# Patient Record
Sex: Male | Born: 1980 | Race: White | Hispanic: No | Marital: Single | Smoking: Former smoker
Health system: Southern US, Community
[De-identification: ages and names within clinical notes are randomized; demographics above are authoritative.]

## PROBLEM LIST (undated history)

## (undated) DIAGNOSIS — I10 Essential (primary) hypertension: Secondary | ICD-10-CM

---

## 2012-04-13 ENCOUNTER — Other Ambulatory Visit: Payer: Self-pay | Admitting: Internal Medicine

## 2012-04-13 DIAGNOSIS — N5089 Other specified disorders of the male genital organs: Secondary | ICD-10-CM

## 2012-04-29 ENCOUNTER — Other Ambulatory Visit: Payer: Self-pay

## 2013-02-01 ENCOUNTER — Ambulatory Visit (HOSPITAL_BASED_OUTPATIENT_CLINIC_OR_DEPARTMENT_OTHER)
Admission: RE | Admit: 2013-02-01 | Discharge: 2013-02-01 | Disposition: A | Discharge status: Home / Self Care | Payer: BC Managed Care – PPO | Source: Ambulatory Visit | Attending: Internal Medicine | Admitting: Internal Medicine

## 2013-02-01 ENCOUNTER — Other Ambulatory Visit (HOSPITAL_BASED_OUTPATIENT_CLINIC_OR_DEPARTMENT_OTHER): Payer: Self-pay

## 2013-02-01 ENCOUNTER — Other Ambulatory Visit (HOSPITAL_BASED_OUTPATIENT_CLINIC_OR_DEPARTMENT_OTHER): Payer: Self-pay | Admitting: Internal Medicine

## 2013-02-01 ENCOUNTER — Other Ambulatory Visit: Payer: Self-pay | Admitting: Internal Medicine

## 2013-02-01 DIAGNOSIS — IMO0002 Reserved for concepts with insufficient information to code with codable children: Secondary | ICD-10-CM

## 2013-02-01 DIAGNOSIS — N509 Disorder of male genital organs, unspecified: Secondary | ICD-10-CM

## 2013-02-01 DIAGNOSIS — N433 Hydrocele, unspecified: Secondary | ICD-10-CM | POA: Insufficient documentation

## 2013-02-01 DIAGNOSIS — N508 Other specified disorders of male genital organs: Secondary | ICD-10-CM | POA: Insufficient documentation

## 2016-06-18 ENCOUNTER — Ambulatory Visit (HOSPITAL_BASED_OUTPATIENT_CLINIC_OR_DEPARTMENT_OTHER)
Admission: RE | Admit: 2016-06-18 | Discharge: 2016-06-18 | Disposition: A | Discharge status: Home / Self Care | Payer: Worker's Compensation | Source: Ambulatory Visit | Attending: Physician Assistant | Admitting: Physician Assistant

## 2016-06-18 ENCOUNTER — Other Ambulatory Visit (HOSPITAL_BASED_OUTPATIENT_CLINIC_OR_DEPARTMENT_OTHER): Payer: Self-pay | Admitting: Physician Assistant

## 2016-06-18 DIAGNOSIS — I82442 Acute embolism and thrombosis of left tibial vein: Secondary | ICD-10-CM | POA: Insufficient documentation

## 2016-06-18 DIAGNOSIS — M79605 Pain in left leg: Secondary | ICD-10-CM

## 2017-04-16 ENCOUNTER — Other Ambulatory Visit: Payer: Self-pay | Admitting: Orthopedic Surgery

## 2017-04-16 DIAGNOSIS — M25511 Pain in right shoulder: Secondary | ICD-10-CM

## 2017-04-28 ENCOUNTER — Other Ambulatory Visit: Payer: Self-pay

## 2017-05-09 ENCOUNTER — Ambulatory Visit
Admission: RE | Admit: 2017-05-09 | Discharge: 2017-05-09 | Disposition: A | Discharge status: Home / Self Care | Payer: Worker's Compensation | Source: Ambulatory Visit | Attending: Orthopedic Surgery | Admitting: Orthopedic Surgery

## 2017-05-09 ENCOUNTER — Ambulatory Visit
Admission: RE | Admit: 2017-05-09 | Discharge: 2017-05-09 | Disposition: A | Discharge status: Home / Self Care | Payer: Self-pay | Source: Ambulatory Visit | Attending: Orthopedic Surgery | Admitting: Orthopedic Surgery

## 2017-05-09 ENCOUNTER — Other Ambulatory Visit: Payer: Self-pay | Admitting: Orthopedic Surgery

## 2017-05-09 DIAGNOSIS — M25511 Pain in right shoulder: Secondary | ICD-10-CM

## 2017-05-09 DIAGNOSIS — Z77018 Contact with and (suspected) exposure to other hazardous metals: Secondary | ICD-10-CM

## 2017-05-09 MED ORDER — IOPAMIDOL (ISOVUE-M 200) INJECTION 41%
15.0000 mL | Freq: Once | INTRAMUSCULAR | Status: AC
  Administered 2017-05-09: 15 mL via INTRA_ARTICULAR

## 2017-10-17 DIAGNOSIS — I10 Essential (primary) hypertension: Secondary | ICD-10-CM | POA: Insufficient documentation

## 2020-02-17 ENCOUNTER — Encounter (HOSPITAL_COMMUNITY): Payer: Self-pay | Admitting: Emergency Medicine

## 2020-02-17 ENCOUNTER — Emergency Department (HOSPITAL_COMMUNITY)
Admission: EM | Admit: 2020-02-17 | Discharge: 2020-02-18 | Disposition: A | Discharge status: Home / Self Care | Payer: Self-pay | Attending: Emergency Medicine | Admitting: Emergency Medicine

## 2020-02-17 DIAGNOSIS — N179 Acute kidney failure, unspecified: Secondary | ICD-10-CM

## 2020-02-17 DIAGNOSIS — I10 Essential (primary) hypertension: Secondary | ICD-10-CM | POA: Insufficient documentation

## 2020-02-17 DIAGNOSIS — T401X1A Poisoning by heroin, accidental (unintentional), initial encounter: Secondary | ICD-10-CM

## 2020-02-17 DIAGNOSIS — F1290 Cannabis use, unspecified, uncomplicated: Secondary | ICD-10-CM | POA: Insufficient documentation

## 2020-02-17 DIAGNOSIS — F1910 Other psychoactive substance abuse, uncomplicated: Secondary | ICD-10-CM | POA: Insufficient documentation

## 2020-02-17 DIAGNOSIS — Z20822 Contact with and (suspected) exposure to covid-19: Secondary | ICD-10-CM | POA: Insufficient documentation

## 2020-02-17 HISTORY — DX: Essential (primary) hypertension: I10

## 2020-02-17 LAB — CBC WITH DIFFERENTIAL/PLATELET
Abs Immature Granulocytes: 0.16 10*3/uL — ABNORMAL HIGH (ref 0.00–0.07)
Basophils Absolute: 0 10*3/uL (ref 0.0–0.1)
Basophils Relative: 0 %
Eosinophils Absolute: 0 10*3/uL (ref 0.0–0.5)
Eosinophils Relative: 0 %
HCT: 46.6 % (ref 39.0–52.0)
Hemoglobin: 15.3 g/dL (ref 13.0–17.0)
Immature Granulocytes: 1 %
Lymphocytes Relative: 4 %
Lymphs Abs: 0.6 10*3/uL — ABNORMAL LOW (ref 0.7–4.0)
MCH: 29.7 pg (ref 26.0–34.0)
MCHC: 32.8 g/dL (ref 30.0–36.0)
MCV: 90.3 fL (ref 80.0–100.0)
Monocytes Absolute: 1.2 10*3/uL — ABNORMAL HIGH (ref 0.1–1.0)
Monocytes Relative: 8 %
Neutro Abs: 12.9 10*3/uL — ABNORMAL HIGH (ref 1.7–7.7)
Neutrophils Relative %: 87 %
Platelets: 219 10*3/uL (ref 150–400)
RBC: 5.16 MIL/uL (ref 4.22–5.81)
RDW: 11.9 % (ref 11.5–15.5)
WBC: 14.9 10*3/uL — ABNORMAL HIGH (ref 4.0–10.5)
nRBC: 0 % (ref 0.0–0.2)

## 2020-02-17 LAB — COMPREHENSIVE METABOLIC PANEL
ALT: 21 U/L (ref 0–44)
AST: 26 U/L (ref 15–41)
Albumin: 4.2 g/dL (ref 3.5–5.0)
Alkaline Phosphatase: 58 U/L (ref 38–126)
Anion gap: 14 (ref 5–15)
BUN: 19 mg/dL (ref 6–20)
CO2: 24 mmol/L (ref 22–32)
Calcium: 9.3 mg/dL (ref 8.9–10.3)
Chloride: 100 mmol/L (ref 98–111)
Creatinine, Ser: 1.87 mg/dL — ABNORMAL HIGH (ref 0.61–1.24)
GFR calc Af Amer: 51 mL/min — ABNORMAL LOW (ref >60)
GFR calc non Af Amer: 44 mL/min — ABNORMAL LOW (ref >60)
Glucose, Bld: 131 mg/dL — ABNORMAL HIGH (ref 70–99)
Potassium: 4.6 mmol/L (ref 3.5–5.1)
Sodium: 138 mmol/L (ref 135–145)
Total Bilirubin: 0.8 mg/dL (ref 0.3–1.2)
Total Protein: 6.7 g/dL (ref 6.5–8.1)

## 2020-02-17 LAB — RAPID URINE DRUG SCREEN, HOSP PERFORMED
Amphetamines: NOT DETECTED
Barbiturates: NOT DETECTED
Benzodiazepines: NOT DETECTED
Cocaine: POSITIVE — AB
Opiates: POSITIVE — AB
Tetrahydrocannabinol: POSITIVE — AB

## 2020-02-17 MED ORDER — SODIUM CHLORIDE 0.9 % IV BOLUS
1000.0000 mL | Freq: Once | INTRAVENOUS | Status: AC
  Administered 2020-02-17: 1000 mL via INTRAVENOUS

## 2020-02-17 NOTE — Progress Notes (Signed)
   02/17/20 2320  TOC ED Mini Assessment  TOC Time spent with patient (minutes): 60  PING Used in TOC Assessment No  Admission or Readmission Diverted Yes  Interventions which prevented an admission or readmission Patient counseling;Other (must enter comment) (Detox resources)  What brought you to the Emergency Department?  Heroin overdose  Barriers to Discharge Continued Medical Work up  Means of departure Not know  Key Contact 1 [facility]  Spoke with Towanda Malkin Date 02/17/20  Contact time 2321  Contact Phone Number [phone]  Call outcome Pt is eligible for transfer to detox facility  Patient states their goals for this hospitalization and ongoing recovery are: Get detox, resatrt recovery program.  CMS Medicare.gov Compare Post Acute Care list provided to: Patient  Choice offered to / list presented to  Patient

## 2020-02-17 NOTE — ED Triage Notes (Signed)
Pt presents to ED BIB [facility]. Per EMS Pt was found by [employer] unresponsive and given 2mg  of Narcan IN. Pt admits to using heroin.  BP 140/70 HR 90  R 18 98% RA

## 2020-02-17 NOTE — Social Work (Signed)
TOC Team met with Pt at bedside. °Team provided supportive listening and substance abuse counseling.  °Team provided NA resources as well as [facility] resources. °When discharged Pt is eligible for transport to [facility] via [facility] After Hours @[phone] ° °

## 2020-02-17 NOTE — ED Provider Notes (Signed)
[facility] EMERGENCY DEPARTMENT Provider Note   CSN: [id number] Arrival date & time: 02/17/20  2151     History Chief Complaint  Patient presents with  . Drug Overdose    Erik Campbell is a 39 y.o. male with past medical history of hypertension, drug abuse, presenting to the emergency department via EMS for suspected drug overdose.  Per EMS, [employer] found patient unresponsive in the driver seat of a car that was in the middle of the road. Patient was noted to have pinpoint pupils. They administered 2 mg of intranasal Narcan and patient reportedly became awake and alert immediately.  Patient is mostly complaining of thirst at this time.  He states he snorted heroin and smoked cocaine. He also endorses marijuana use.  Denies alcohol.  He has no other complaints at this time.  The history is provided by the patient and the EMS personnel.       Past Medical History:  Diagnosis Date  . Hypertension     There are no problems to display for this patient.   History reviewed. No pertinent surgical history.     History reviewed. No pertinent family history.  Social History   Tobacco Use  . Smoking status: Not on file  Substance Use Topics  . Alcohol use: Not on file  . Drug use: Not on file    Home Medications Prior to Admission medications   Not on File    Allergies    Patient has no allergy information on record.  Review of Systems   Review of Systems  All other systems reviewed and are negative.   Physical Exam Updated Vital Signs BP 107/74   Pulse 66   Temp 97.7 F (36.5 C) (Oral)   Resp 16   SpO2 92%   Physical Exam Vitals and nursing note reviewed.  Constitutional:      Appearance: He is well-developed.  HENT:     Head: Normocephalic and atraumatic.  Eyes:     Conjunctiva/sclera: Conjunctivae normal.     Comments: Pupils are equal and constricted, reactive to light  Cardiovascular:     Rate and Rhythm: Normal rate and regular  rhythm.  Pulmonary:     Effort: Pulmonary effort is normal. No respiratory distress.     Breath sounds: Normal breath sounds.  Abdominal:     General: Bowel sounds are normal.     Palpations: Abdomen is soft.     Tenderness: There is no abdominal tenderness.  Skin:    General: Skin is warm.  Neurological:     Mental Status: He is alert and oriented to person, place, and time.  Psychiatric:        Behavior: Behavior normal.     ED Results / Procedures / Treatments   Labs (all labs ordered are listed, but only abnormal results are displayed) Labs Reviewed  COMPREHENSIVE METABOLIC PANEL - Abnormal; Notable for the following components:      Result Value   Glucose, Bld 131 (*)    Creatinine, Ser 1.87 (*)    GFR calc non Af Amer 44 (*)    GFR calc Af Amer 51 (*)    All other components within normal limits  CBC WITH DIFFERENTIAL/PLATELET - Abnormal; Notable for the following components:   WBC 14.9 (*)    Neutro Abs 12.9 (*)    Lymphs Abs 0.6 (*)    Monocytes Absolute 1.2 (*)    Abs Immature Granulocytes 0.16 (*)    All  other components within normal limits  RAPID URINE DRUG SCREEN, HOSP PERFORMED - Abnormal; Notable for the following components:   Opiates POSITIVE (*)    Cocaine POSITIVE (*)    Tetrahydrocannabinol POSITIVE (*)    All other components within normal limits  RESPIRATORY PANEL BY RT PCR (FLU A&B, COVID)  ETHANOL    EKG EKG Interpretation  Date/Time:  Thursday February 17 2020 21:52:48 EDT Ventricular Rate:  84 PR Interval:    QRS Duration: 89 QT Interval:  362 QTC Calculation: 428 R Axis:   31 Text Interpretation: Sinus rhythm No old tracing to compare Confirmed by Jacalyn Lefevre [phone]) on 02/17/2020 10:23:49 PM   Radiology No results found.  Procedures Procedures (including critical care time)  Medications Ordered in ED Medications  sodium chloride 0.9 % bolus 1,000 mL (1,000 mLs Intravenous New Bag/Given 02/17/20 2352)    ED Course  I  have reviewed the triage vital signs and the nursing notes.  Pertinent labs & imaging results that were available during my care of the patient were reviewed by me and considered in my medical decision making (see chart for details).  Clinical Course as of Feb 18 20  Thu Feb 17, 2020  2240 Social work provided outpatient resources to patient regarding detox.    [JR]  2315 [employer] reporting patient is to be discharged to police custody, currently obtaining warrant for patient's blood for DWI.   [JR]    Clinical Course User Index [JR] Daxter Paule, [place] N, PA-C   MDM Rules/Calculators/A&P                          Pt presenting as unintentional overdose of heroin and cocaine, found by [employer] unresponsive in driver seat of a vehicle that was stopped in the middle of the road.  [employer] administered 2 mg of intranasal Narcan and patient was alert.  He states he snorted heroin and smoked cocaine, also endorses marijuana use.  Patient is drowsy though oriented and without medical complaints.  Initial labs reveal slight leukocytosis of 14.9, creatinine 1.8, unclear baseline.  He is given IV fluids.  Vital signs are stable.  UDS is positive for opiates, cocaine and marijuana.  Covid swab is pending.  Care assumed at shift change by PA McDonald.  Anticipate discharge after IV fluids.  Patient to be discharged to [employer] custody.  [employer] is currently obtaining a warrant for patient's blood for DWI.  Final Clinical Impression(s) / ED Diagnoses Final diagnoses:  Polysubstance abuse (HCC)  Accidental overdose of heroin, initial encounter [facility])    Rx / DC Orders ED Discharge Orders    None       Anniyah Mood, [place] N, PA-C 02/18/20 Shanon Payor, MD 02/18/20 1536

## 2020-02-17 NOTE — ED Provider Notes (Signed)
39 year old male received a signout from [place] pending IV fluids and COVID-19 test. Per his HPI:   "Erik Campbell is a 39 y.o. male with past medical history of hypertension, drug abuse, presenting to the emergency department via EMS for suspected drug overdose.  Per EMS, [employer] found patient down unresponsive w pinpoint pupils. They administered 2 mg of intranasal Narcan and patient reportedly because awake and alert immediately.  Patient is mostly complaining of thirst at this time.  He states he snorted 1 gram of "opiates," is unable to elaborate more on details of the drug.  Denies alcohol or other drug use.  He has no other complaints at this time.  The history is provided by the patient and the EMS personnel."   On my evaluation, patient has no complaints.  He is requesting ginger ale to drink.  He reports that earlier he spoke with social work and would still like to go to detox.  Physical Exam  BP 112/86 (BP Location: Right Arm)    Pulse 64    Temp 98.2 F (36.8 C) (Oral)    Resp 16    SpO2 98%   Physical Exam Vitals and nursing note reviewed.  Constitutional:      Appearance: He is well-developed.     Comments: Sleeping on entry to the room, but arouses easily to voice.  HENT:     Head: Normocephalic and atraumatic.  Eyes:     Pupils: Pupils are equal, round, and reactive to light.  Cardiovascular:     Rate and Rhythm: Normal rate and regular rhythm.     Pulses: Normal pulses.     Heart sounds: Normal heart sounds. No murmur heard.  No friction rub. No gallop.   Pulmonary:     Effort: Pulmonary effort is normal. No respiratory distress.     Breath sounds: No stridor. No wheezing, rhonchi or rales.  Chest:     Chest wall: No tenderness.  Musculoskeletal:     Cervical back: Neck supple.  Neurological:     Mental Status: He is alert and oriented to person, place, and time.     Cranial Nerves: No cranial nerve deficit.     Comments: Answers questions appropriately.   Speech is not slurred.  Psychiatric:        Behavior: Behavior normal.     ED Course/Procedures   Clinical Course as of Feb 17 341  Thu Feb 17, 2020  2240 Social work provided outpatient resources to patient regarding detox.    [JR]  2315 [employer] reporting patient is to be discharged to police custody, currently obtaining warrant for patient's blood for DWI.   [JR]    Clinical Course User Index [JR] Robinson, [place] N, PA-C    Procedures  MDM  39 year old male received at signout from [facility] pending COVID-19 test.  Please see her note for further work-up and medical decision making.  In brief, the patient brought in after he was found unresponsive by [employer].  He was given Narcan in route and endorsed heroin use tonight.  He was found to have an AKI that was treated with IV fluids.  He has not required any additional doses of Narcan since arriving in the ER.  On my evaluation, patient is not hypoxic and has no tachypnea.  He is sleeping, but arouses easily and answers questions in complete, fluent sentences.  He is requesting some ginger ale to drink.  He notably has already gotten up and walked to  the restroom.  We will plan to fluid challenge the patient.  Initial plan was that patient was going to be discharged to jail.  However, officers now reports that the patient has been served paperwork for an upcoming court date.  He will not be incarcerated tonight.  Earlier Quar[employer] manager, social work discussed detox options with the patient and he is eligible for transport to [facility] detox via safe transport after he is medically cleared.  On my evaluation with the patient, he is still agreeable to being discharged to detox facility.  We will need to make sure that the patient is successfully fluid challenged and alert than he can [facility].  Will reassess after fluid challenge.  Patient has been successfully fluid challenge.  He is medically cleared at this time.  RN will arrange  safe [facility].  ER return precautions given.  Safe for discharge at this time.    Barkley Boards, PA-C 02/18/20 0342    Nira Conn, MD 02/18/20 [phone]

## 2020-02-18 ENCOUNTER — Encounter (HOSPITAL_COMMUNITY): Payer: Self-pay | Admitting: Emergency Medicine

## 2020-02-18 LAB — RESPIRATORY PANEL BY RT PCR (FLU A&B, COVID)
Influenza A by PCR: NEGATIVE
Influenza B by PCR: NEGATIVE
SARS Coronavirus 2 by RT PCR: NEGATIVE

## 2020-02-18 NOTE — Discharge Instructions (Signed)
Thank you for allowing me to care for you today in the Emergency Department.   You were seen today after an accidental overdose.  You were given Narcan.  You were found to have an increase in your kidney function, which was treated with fluids.  You were agreeable to being transferred to [facility] for detox.  Return to the emergency department for new or worsening symptoms.

## 2020-02-18 NOTE — ED Notes (Signed)
Patient verbalizes understanding of discharge instructions. Opportunity for questioning and answers were provided. Armband removed by staff, pt discharged from ED to [facility] via Safe Transport stable & ambulatory

## 2020-02-18 NOTE — ED Notes (Addendum)
Safe Transport has been called and in route to pick up pt. [facility] notified of pt's discharge/anticipated arrival, are expecting pt.

## 2020-02-18 NOTE — ED Notes (Signed)
Pt provided ginger ale for fluid challenge per request. Pt tolerating well, able to ambulate to/from RR without assistance, CA&O. NAD noted, no requests/needs expressed at this time.

## 2020-03-21 ENCOUNTER — Other Ambulatory Visit: Payer: Self-pay

## 2020-03-21 ENCOUNTER — Ambulatory Visit: Payer: Self-pay | Attending: Family | Admitting: Family

## 2020-03-21 ENCOUNTER — Encounter: Payer: Self-pay | Admitting: Family

## 2020-03-21 ENCOUNTER — Other Ambulatory Visit: Payer: Self-pay | Admitting: Family Medicine

## 2020-03-21 VITALS — BP 143/87 | HR 53 | Resp 16 | Ht 71.0 in | Wt 213.0 lb

## 2020-03-21 DIAGNOSIS — Z131 Encounter for screening for diabetes mellitus: Secondary | ICD-10-CM

## 2020-03-21 DIAGNOSIS — I10 Essential (primary) hypertension: Secondary | ICD-10-CM

## 2020-03-21 DIAGNOSIS — Z7689 Persons encountering health services in other specified circumstances: Secondary | ICD-10-CM

## 2020-03-21 DIAGNOSIS — R350 Frequency of micturition: Secondary | ICD-10-CM

## 2020-03-21 LAB — POCT URINALYSIS DIP (CLINITEK)
Bilirubin, UA: NEGATIVE
Blood, UA: NEGATIVE
Glucose, UA: NEGATIVE mg/dL
Ketones, POC UA: NEGATIVE mg/dL
Leukocytes, UA: NEGATIVE
Nitrite, UA: NEGATIVE
POC PROTEIN,UA: NEGATIVE
Spec Grav, UA: 1.015 (ref 1.010–1.025)
Urobilinogen, UA: 0.2 E.U./dL
pH, UA: 6.5 (ref 5.0–8.0)

## 2020-03-21 MED ORDER — LISINOPRIL 30 MG PO TABS: 30.0000 mg | ORAL_TABLET | Freq: Every day | ORAL | 0 refills | Status: DC

## 2020-03-21 NOTE — Patient Instructions (Signed)
Increase Lisinopril to 30 mg/day for high blood pressure. Lab today. Blood pressure check in 1 month with clinical pharmacist. Follow-up with primary physician in 2 months or sooner if needed. Hypertension, Adult Hypertension is another name for high blood pressure. High blood pressure forces your heart to work harder to pump blood. This can cause problems over time. There are two numbers in a blood pressure reading. There is a top number (systolic) over a bottom number (diastolic). It is best to have a blood pressure that is below 120/80. Healthy choices can help lower your blood pressure, or you may need medicine to help lower it. What are the causes? The cause of this condition is not known. Some conditions may be related to high blood pressure. What increases the risk?  Smoking.  Having type 2 diabetes mellitus, high cholesterol, or both.  Not getting enough exercise or physical activity.  Being overweight.  Having too much fat, sugar, calories, or salt (sodium) in your diet.  Drinking too much alcohol.  Having long-term (chronic) kidney disease.  Having a family history of high blood pressure.  Age. Risk increases with age.  Race. You may be at higher risk if you are African American.  Gender. Men are at higher risk than women before age 85. After age 36, women are at higher risk than men.  Having obstructive sleep apnea.  Stress. What are the signs or symptoms?  High blood pressure may not cause symptoms. Very high blood pressure (hypertensive crisis) may cause: ? Headache. ? Feelings of worry or nervousness (anxiety). ? Shortness of breath. ? Nosebleed. ? A feeling of being sick to your stomach (nausea). ? Throwing up (vomiting). ? Changes in how you see. ? Very bad chest pain. ? Seizures. How is this treated?  This condition is treated by making healthy lifestyle changes, such as: ? Eating healthy foods. ? Exercising more. ? Drinking less alcohol.  Your  health care provider may prescribe medicine if lifestyle changes are not enough to get your blood pressure under control, and if: ? Your top number is above 130. ? Your bottom number is above 80.  Your personal target blood pressure may vary. Follow these instructions at home: Eating and drinking   If told, follow the DASH eating plan. To follow this plan: ? Fill one half of your plate at each meal with fruits and vegetables. ? Fill one fourth of your plate at each meal with whole grains. Whole grains include whole-wheat pasta, brown rice, and whole-grain bread. ? Eat or drink low-fat dairy products, such as skim milk or low-fat yogurt. ? Fill one fourth of your plate at each meal with low-fat (lean) proteins. Low-fat proteins include fish, chicken without skin, eggs, beans, and tofu. ? Avoid fatty meat, cured and processed meat, or chicken with skin. ? Avoid pre-made or processed food.  Eat less than 1,500 mg of salt each day.  Do not drink alcohol if: ? Your doctor tells you not to drink. ? You are pregnant, may be pregnant, or are planning to become pregnant.  If you drink alcohol: ? Limit how much you use to:  0-1 drink a day for women.  0-2 drinks a day for men. ? Be aware of how much alcohol is in your drink. In the [place] one drink equals one 12 oz bottle of beer (355 mL), one 5 oz glass of wine (148 mL), or one 1 oz glass of hard liquor (44 mL). Lifestyle   Work with  your doctor to stay at a healthy weight or to lose weight. Ask your doctor what the best weight is for you.  Get at least 30 minutes of exercise most days of the week. This may include walking, swimming, or biking.  Get at least 30 minutes of exercise that strengthens your muscles (resistance exercise) at least 3 days a week. This may include lifting weights or doing Pilates.  Do not use any products that contain nicotine or tobacco, such as cigarettes, e-cigarettes, and chewing tobacco. If you need help  quitting, ask your doctor.  Check your blood pressure at home as told by your doctor.  Keep all follow-up visits as told by your doctor. This is important. Medicines  Take over-the-counter and prescription medicines only as told by your doctor. Follow directions carefully.  Do not skip doses of blood pressure medicine. The medicine does not work as well if you skip doses. Skipping doses also puts you at risk for problems.  Ask your doctor about side effects or reactions to medicines that you should watch for. Contact a doctor if you:  Think you are having a reaction to the medicine you are taking.  Have headaches that keep coming back (recurring).  Feel dizzy.  Have swelling in your ankles.  Have trouble with your vision. Get help right away if you:  Get a very bad headache.  Start to feel mixed up (confused).  Feel weak or numb.  Feel faint.  Have very bad pain in your: ? Chest. ? Belly (abdomen).  Throw up more than once.  Have trouble breathing. Summary  Hypertension is another name for high blood pressure.  High blood pressure forces your heart to work harder to pump blood.  For most people, a normal blood pressure is less than 120/80.  Making healthy choices can help lower blood pressure. If your blood pressure does not get lower with healthy choices, you may need to take medicine. This information is not intended to replace advice given to you by your health care provider. Make sure you discuss any questions you have with your health care provider. Document Revised: 01/14/2018 Document Reviewed: 01/14/2018 Elsevier Patient Education  2020 [employer].

## 2020-03-21 NOTE — Telephone Encounter (Signed)
Medication Refill - Medication: Lisinopril, meloxicam   Has the patient contacted their pharmacy? Yes.   (Agent: If no, request that the patient contact the pharmacy for the refill.) (Agent: If yes, when and what did the pharmacy advise?)  Preferred Pharmacy (with phone number or street name):  [facility] [place], [place] - [address]  [address] [place]  [address]  Phone: [phone] Fax: [phone]  Hours: Not open 24 hours     Agent: Please be advised that RX refills may take up to 3 business days. We ask that you follow-up with your pharmacy.

## 2020-03-21 NOTE — Progress Notes (Signed)
Patient ID: Erik Campbell, male    DOB: Sep 29, 1980  MRN: [id number]  CC: Hypertension Follow-Up and Establish Care  Subjective: Erik Campbell is a 39 y.o. male with history of hypertension who presents for hypertension follow-up and to establish care.    1. BLOOD PRESSURE CONCERN: 12/13/2019: Visit at [facility] with physician assistant Mancel Bale. Blood pressure very uncontrolled at that time. Patient noncompliant with taking medication. Patient agreed to begin taking medication. Follow-up in 2 weeks for blood pressure check to see if titration of medication is needed.  03/21/2020: Presents today with blood pressure concerns and requesting medication refill. Reports that he is currently at [facility] in a detox program after being charged with a DWI in September 2021 and says he will be discharged from the program in 3 days. Reports he had high blood pressure beginning in his 30's but noticed that his numbers are continuing to increase as he becomes older.  Currently taking: see medication list Have you taken your blood pressure medication today: [x]  Yes []  No  Med Adherence: [x]  Yes    []  No Medication side effects: []  Yes    [x]  No  Adherence with salt restriction: [x]  Yes    []  No Exercise: Yes [x]  No []  Home Monitoring?: [x]  Yes    []  No Monitoring Frequency: [x]  Yes    []  No  Home BP results range: [x]  Yes, 120's-140's/90s Smoking [x]  Yes []  No SOB? []  Yes    [x]  No Chest Pain?: []  Yes    [x]  No Leg swelling?: []  Yes    [x]  No Headaches?: []  Yes    [x]  No Dizziness? []  Yes    [x]  No  2. ESTABLISH CARE: PRESENT ILLNESS:   Allergies: denies  Alcohol and drug use: On 02/17/2020 positive for opiates, cocaine, and tetrahydrocannabinol. Currently at [facility] in detox program for DWI. Reports he has phlebitis of left arm from intravenous infusions during admission to the hospital on 02/17/2020.   Medications: Meloxicam for shoulder pain right shoulder  PAST  HISTORY:   Childhood Illnesses: denies   Surgical History: right shoulder history 2018 and 2019  Mental Health History: denies  FAMILY HISTORY:   Mother - hypertension   Maternal grandfather - hypertension  PERSONAL AND SOCIAL HISTORY:   Occupation:   Last year of schooling: community college   Home: alone  3. FREQUENT URINATION: Reports having frequent urination. Denies pain.   Current Outpatient Medications on File Prior to Visit  Medication Sig Dispense Refill  . meloxicam (MOBIC) 7.5 MG tablet Take by mouth.     No current facility-administered medications on file prior to visit.    No Known Allergies  Social History   Socioeconomic History  . Marital status: Single    Spouse name: Not on file  . Number of children: Not on file  . Years of education: Not on file  . Highest education level: Not on file  Occupational History  . Not on file  Tobacco Use  . Smoking status: Former  . Smokeless tobacco: Never Used  Vaping Use  . Vaping Use: Never used  Substance and Sexual Activity  . Alcohol use: Not Currently    Comment: not in the past 30 days   . Drug use: Not Currently    Comment: not in the past 30 days   . Sexual activity: Not Currently    Comment: not in the past 30 days   Other Topics Concern  .  Not on file  Social History Narrative  . Not on file   Social Determinants of Health   Financial Resource Strain:   . Difficulty of Paying Living Expenses: Not on file  Food Insecurity:   . Worried About Programme researcher, broadcasting/film/video in the Last Year: Not on file  . Ran Out of Food in the Last Year: Not on file  Transportation Needs:   . Lack of Transportation (Medical): Not on file  . Lack of Transportation (Non-Medical): Not on file  Physical Activity:   . Days of Exercise per Week: Not on file  . Minutes of Exercise per Session: Not on file  Stress:   . Feeling of Stress : Not on file  Social Connections:   . Frequency of  Communication with Friends and Family: Not on file  . Frequency of Social Gatherings with Friends and Family: Not on file  . Attends Religious Services: Not on file  . Active Member of Clubs or Organizations: Not on file  . Attends Banker Meetings: Not on file  . Marital Status: Not on file  Intimate Partner Violence:   . Fear of Current or Ex-Partner: Not on file  . Emotionally Abused: Not on file  . Physically Abused: Not on file  . Sexually Abused: Not on file    No family history on file.  No past surgical history on file.  ROS: Review of Systems Negative except as stated above  PHYSICAL EXAM: BP (!) 143/87   Pulse (!) 53   Resp 16   Ht 5\' 11"  (1.803 m)   Wt 213 lb (96.6 kg)   SpO2 98%   BMI 29.71 kg/m   Physical Exam General appearance - alert, well appearing, and in no distress and oriented to person, place, and time Mental status - alert, oriented to person, place, and time, normal mood, behavior, speech, dress, motor activity, and thought processes Neck - supple, no significant adenopathy Lymphatics - no palpable lymphadenopathy, no hepatosplenomegaly Chest - clear to auscultation, no wheezes, rales or rhonchi, symmetric air entry, no tachypnea, retractions or cyanosis Heart - normal rate, regular rhythm, normal S1, S2, no murmurs, rubs, clicks or gallops Neurological - alert, oriented, normal speech, no focal findings or movement disorder noted  Results for orders placed or performed in visit on 03/21/20  Hemoglobin A1c  Result Value Ref Range   Hgb A1c MFr Bld 5.1 4.8 - 5.6 %   Est. average glucose Bld gHb Est-mCnc 100 mg/dL  POCT URINALYSIS DIP (CLINITEK)  Result Value Ref Range   Color, UA yellow yellow   Clarity, UA clear clear   Glucose, UA negative negative mg/dL   Bilirubin, UA negative negative   Ketones, POC UA negative negative mg/dL   Spec Grav, UA 13/02/21 9.407 - 1.025   Blood, UA negative negative   pH, UA 6.5 5.0 - 8.0   POC  PROTEIN,UA negative negative, trace   Urobilinogen, UA 0.2 0.2 or 1.0 E.U./dL   Nitrite, UA Negative Negative   Leukocytes, UA Negative Negative    ASSESSMENT AND PLAN: 1. Essential hypertension: - Blood pressure not at goal during today's visit. Patient asymptomatic without chest pressure, chest pain, palpitations, and shortness of breath. - Increase Lisinopril from 20 mg daily to 30 mg daily.  - Follow-up with in 4 weeks with clinical pharmacist for blood pressure check. Write down your blood pressure readings each day and bring those results along with your home blood pressure monitor to your  appointment. Medications may be adjusted at that time if needed. - BMP to check kidney function and electrolyte balance.  - Follow-up with primary physician in 3 months or sooner if needed. - lisinopril (ZESTRIL) 30 MG tablet; Take 1 tablet (30 mg total) by mouth daily.  Dispense: 30 tablet; Refill: 0 - Basic Metabolic Panel; Future  2. Encounter to establish care: - Patient to establish care after discharge from hospital.  - Health history completed today.  - Return in 2 months or sooner if needed for physical examination with primary physician.  3. Frequent urination: - Urinalysis to screen for urinary tract infection. Resulted negative.  - Urine cytology to screen for sexually transmitted infections.  - POCT URINALYSIS DIP (CLINITEK) - Urine cytology ancillary only  4. Diabetes mellitus screening: - Patient with frequent urination.  - Hemoglobin A1C to screen for pre-diabetes/diabetes, normal today at 5.1%. - Hemoglobin A1c  Patient was given the opportunity to ask questions.  Patient verbalized understanding of the plan and was able to repeat key elements of the plan. Patient was given clear instructions to go to Emergency Department or return to medical center if symptoms don't improve, worsen, or new problems develop.The patient verbalized understanding.   Orders Placed This Encounter   Procedures  . Hemoglobin A1c  . Basic Metabolic Panel  . POCT URINALYSIS DIP (CLINITEK)     Requested Prescriptions   Signed Prescriptions Disp Refills  . lisinopril (ZESTRIL) 30 MG tablet 30 tablet 0    Sig: Take 1 tablet (30 mg total) by mouth daily.    Return in about 4 weeks (around 04/18/2020) for with Franky Macho and 2 months with any primary physician for physical exam.  Carlo Lorson Jodi Geralds, NP

## 2020-03-22 LAB — HEMOGLOBIN A1C
Est. average glucose Bld gHb Est-mCnc: 100 mg/dL
Hgb A1c MFr Bld: 5.1 % (ref 4.8–5.6)

## 2020-03-24 LAB — URINE CYTOLOGY ANCILLARY ONLY
Bacterial Vaginitis-Urine: NEGATIVE
Candida Urine: NEGATIVE
Chlamydia: NEGATIVE
Comment: NEGATIVE
Comment: NEGATIVE
Comment: NORMAL
Neisseria Gonorrhea: NEGATIVE
Trichomonas: NEGATIVE

## 2020-03-24 NOTE — Progress Notes (Signed)
Please call patient with updates.   No pre-diabetes/diabetes.  No sexually transmitted infections in urine.  No UTI.   Remind patient that he should come in for blood work to check his kidney function prior to his appointment on 04/18/2020. Does not need appointment for lab.

## 2020-03-29 ENCOUNTER — Telehealth: Payer: Self-pay

## 2020-03-29 NOTE — Telephone Encounter (Signed)
Contacted pt to go over lab results lvm  

## 2020-04-05 ENCOUNTER — Telehealth (HOSPITAL_COMMUNITY): Payer: Self-pay | Admitting: Behavioral Health

## 2020-04-12 ENCOUNTER — Ambulatory Visit (HOSPITAL_COMMUNITY): Payer: No Payment, Other | Admitting: Behavioral Health

## 2020-04-18 ENCOUNTER — Ambulatory Visit: Payer: Self-pay | Admitting: Pharmacist

## 2020-05-23 ENCOUNTER — Encounter: Payer: Self-pay | Admitting: Family Medicine

## 2020-06-30 ENCOUNTER — Emergency Department (HOSPITAL_COMMUNITY)
Admission: EM | Admit: 2020-06-30 | Discharge: 2020-07-01 | Disposition: A | Discharge status: Home / Self Care | Payer: Self-pay | Attending: Emergency Medicine | Admitting: Emergency Medicine

## 2020-06-30 ENCOUNTER — Other Ambulatory Visit: Payer: Self-pay

## 2020-06-30 ENCOUNTER — Emergency Department (HOSPITAL_COMMUNITY): Payer: Self-pay

## 2020-06-30 DIAGNOSIS — R220 Localized swelling, mass and lump, head: Secondary | ICD-10-CM | POA: Insufficient documentation

## 2020-06-30 DIAGNOSIS — I10 Essential (primary) hypertension: Secondary | ICD-10-CM | POA: Insufficient documentation

## 2020-06-30 DIAGNOSIS — F1110 Opioid abuse, uncomplicated: Secondary | ICD-10-CM | POA: Insufficient documentation

## 2020-06-30 DIAGNOSIS — Z87891 Personal history of nicotine dependence: Secondary | ICD-10-CM | POA: Insufficient documentation

## 2020-06-30 DIAGNOSIS — H9193 Unspecified hearing loss, bilateral: Secondary | ICD-10-CM | POA: Insufficient documentation

## 2020-06-30 DIAGNOSIS — Z79899 Other long term (current) drug therapy: Secondary | ICD-10-CM | POA: Insufficient documentation

## 2020-06-30 NOTE — ED Triage Notes (Addendum)
Pt presents to ED POV. Pt c/o bilateral hearing loss. Pt reports that he thinks he overdosed. Pt states that he used heroin and woke up and could not hear anything. And reports that he has knot on back of head.

## 2020-07-01 ENCOUNTER — Emergency Department (HOSPITAL_COMMUNITY): Payer: Self-pay

## 2020-07-01 LAB — COMPREHENSIVE METABOLIC PANEL
ALT: 489 U/L — ABNORMAL HIGH (ref 0–44)
AST: 237 U/L — ABNORMAL HIGH (ref 15–41)
Albumin: 3.5 g/dL (ref 3.5–5.0)
Alkaline Phosphatase: 48 U/L (ref 38–126)
Anion gap: 11 (ref 5–15)
BUN: 14 mg/dL (ref 6–20)
CO2: 28 mmol/L (ref 22–32)
Calcium: 9.2 mg/dL (ref 8.9–10.3)
Chloride: 99 mmol/L (ref 98–111)
Creatinine, Ser: 1.3 mg/dL — ABNORMAL HIGH (ref 0.61–1.24)
GFR, Estimated: >60 mL/min (ref >60)
Glucose, Bld: 103 mg/dL — ABNORMAL HIGH (ref 70–99)
Potassium: 4.1 mmol/L (ref 3.5–5.1)
Sodium: 138 mmol/L (ref 135–145)
Total Bilirubin: 0.9 mg/dL (ref 0.3–1.2)
Total Protein: 5.7 g/dL — ABNORMAL LOW (ref 6.5–8.1)

## 2020-07-01 LAB — CBC WITH DIFFERENTIAL/PLATELET
Abs Immature Granulocytes: 0.07 10*3/uL (ref 0.00–0.07)
Basophils Absolute: 0 10*3/uL (ref 0.0–0.1)
Basophils Relative: 0 %
Eosinophils Absolute: 0 10*3/uL (ref 0.0–0.5)
Eosinophils Relative: 0 %
HCT: 40.9 % (ref 39.0–52.0)
Hemoglobin: 14 g/dL (ref 13.0–17.0)
Immature Granulocytes: 1 %
Lymphocytes Relative: 20 %
Lymphs Abs: 1.9 10*3/uL (ref 0.7–4.0)
MCH: 30.5 pg (ref 26.0–34.0)
MCHC: 34.2 g/dL (ref 30.0–36.0)
MCV: 89.1 fL (ref 80.0–100.0)
Monocytes Absolute: 0.8 10*3/uL (ref 0.1–1.0)
Monocytes Relative: 9 %
Neutro Abs: 6.5 10*3/uL (ref 1.7–7.7)
Neutrophils Relative %: 70 %
Platelets: 176 10*3/uL (ref 150–400)
RBC: 4.59 MIL/uL (ref 4.22–5.81)
RDW: 11.5 % (ref 11.5–15.5)
WBC: 9.2 10*3/uL (ref 4.0–10.5)
nRBC: 0 % (ref 0.0–0.2)

## 2020-07-01 LAB — URINALYSIS, ROUTINE W REFLEX MICROSCOPIC
Bilirubin Urine: NEGATIVE
Glucose, UA: NEGATIVE mg/dL
Hgb urine dipstick: NEGATIVE
Ketones, ur: NEGATIVE mg/dL
Leukocytes,Ua: NEGATIVE
Nitrite: NEGATIVE
Protein, ur: NEGATIVE mg/dL
Specific Gravity, Urine: 1.009 (ref 1.005–1.030)
pH: 7 (ref 5.0–8.0)

## 2020-07-01 LAB — ETHANOL: Alcohol, Ethyl (B): <10 mg/dL (ref <10)

## 2020-07-01 LAB — CBG MONITORING, ED: Glucose-Capillary: 95 mg/dL (ref 70–99)

## 2020-07-01 LAB — RAPID URINE DRUG SCREEN, HOSP PERFORMED
Amphetamines: NOT DETECTED
Barbiturates: NOT DETECTED
Benzodiazepines: NOT DETECTED
Cocaine: POSITIVE — AB
Opiates: NOT DETECTED
Tetrahydrocannabinol: NOT DETECTED

## 2020-07-01 LAB — ACETAMINOPHEN LEVEL: Acetaminophen (Tylenol), Serum: <10 ug/mL — ABNORMAL LOW (ref 10–30)

## 2020-07-01 LAB — HIV ANTIBODY (ROUTINE TESTING W REFLEX): HIV Screen 4th Generation wRfx: NONREACTIVE

## 2020-07-01 LAB — SEDIMENTATION RATE: Sed Rate: 7 mm/hr (ref 0–16)

## 2020-07-01 LAB — TSH: TSH: 0.731 u[IU]/mL (ref 0.350–4.500)

## 2020-07-01 LAB — SALICYLATE LEVEL: Salicylate Lvl: <7 mg/dL — ABNORMAL LOW (ref 7.0–30.0)

## 2020-07-01 MED ORDER — SODIUM CHLORIDE 0.9 % IV SOLN: INTRAVENOUS | Status: DC

## 2020-07-01 MED ORDER — LISINOPRIL 30 MG PO TABS: 30.0000 mg | ORAL_TABLET | Freq: Every day | ORAL | 0 refills | Status: DC

## 2020-07-01 MED ORDER — PREDNISONE 10 MG (21) PO TBPK: ORAL_TABLET | ORAL | 0 refills | Status: DC

## 2020-07-01 MED ORDER — SODIUM CHLORIDE 0.9 % IV BOLUS
1000.0000 mL | Freq: Once | INTRAVENOUS | Status: AC
  Administered 2020-07-01: 1000 mL via INTRAVENOUS

## 2020-07-01 MED ORDER — GADOBUTROL 1 MMOL/ML IV SOLN
10.0000 mL | Freq: Once | INTRAVENOUS | Status: AC | PRN
  Administered 2020-07-01: 10 mL via INTRAVENOUS

## 2020-07-01 NOTE — ED Provider Notes (Signed)
[facility] EMERGENCY DEPARTMENT Provider Note   CSN: [id number] Arrival date & time: 06/30/20  1846     History Chief Complaint  Patient presents with  . Hearing Problem    Erik Campbell is a 40 y.o. male.  Pt presents to the ED today with bilateral hearing loss.  Pt said he thinks he overdosed on "opiates."  He thought he ws in bed when he did this.  However, he woke up and had a knot on his head and could not hear out of either ear.  Pt denies any pain.  He denies taking any other drugs (like asa)  He initially could not hear anything, now he can hear a little bit.  [facility] in [facility].        Past Medical History:  Diagnosis Date  . Hypertension     There are no problems to display for this patient.   No past surgical history on file.     No family history on file.  Social History   Tobacco Use  . Smoking status: Former Games developer  . Smokeless tobacco: Never Used  Vaping Use  . Vaping Use: Never used  Substance Use Topics  . Alcohol use: Not Currently    Comment: not in the past 30 days   . Drug use: Not Currently    Comment: not in the past 30 days     Home Medications Prior to Admission medications   Medication Sig Start Date End Date Taking? Authorizing Provider  predniSONE (STERAPRED UNI-PAK 21 TAB) 10 MG (21) TBPK tablet Take 6 tabs for 2 days, then 5 for 2 days, then 4 for 2 days, then 3 for 2 days, 2 for 2 days, then 1 for 2 days 07/01/20  Yes Jacalyn Lefevre, MD  lisinopril (ZESTRIL) 30 MG tablet Take 1 tablet (30 mg total) by mouth daily. 07/01/20   Jacalyn Lefevre, MD  meloxicam (MOBIC) 7.5 MG tablet Take by mouth. 12/13/19   [provider]    Allergies    Patient has no known allergies.  Review of Systems   Review of Systems  HENT: Positive for hearing loss.   All other systems reviewed and are negative.   Physical Exam Updated Vital Signs BP (!) 172/104   Pulse 67   Temp 98.6 F (37 C) (Oral)    Resp 14   SpO2 100%   Physical Exam Vitals and nursing note reviewed.  Constitutional:      Appearance: Normal appearance.  HENT:     Head: Normocephalic and atraumatic.      Right Ear: Tympanic membrane, ear canal and external ear normal.     Left Ear: Tympanic membrane, ear canal and external ear normal.     Nose: Nose normal.     Mouth/Throat:     Mouth: Mucous membranes are moist.     Pharynx: Oropharynx is clear.  Eyes:     Extraocular Movements: Extraocular movements intact.     Conjunctiva/sclera: Conjunctivae normal.     Pupils: Pupils are equal, round, and reactive to light.  Cardiovascular:     Rate and Rhythm: Normal rate and regular rhythm.     Pulses: Normal pulses.     Heart sounds: Normal heart sounds.  Pulmonary:     Effort: Pulmonary effort is normal.     Breath sounds: Normal breath sounds.  Abdominal:     General: Abdomen is flat. Bowel sounds are normal.  Palpations: Abdomen is soft.  Musculoskeletal:        General: Normal range of motion.     Cervical back: Normal range of motion and neck supple.  Skin:    General: Skin is warm.     Capillary Refill: Capillary refill takes less than 2 seconds.  Neurological:     General: No focal deficit present.     Mental Status: He is alert and oriented to person, place, and time.  Psychiatric:        Mood and Affect: Mood normal.        Behavior: Behavior normal.        Thought Content: Thought content normal.        Judgment: Judgment normal.     ED Results / Procedures / Treatments   Labs (all labs ordered are listed, but only abnormal results are displayed) Labs Reviewed  COMPREHENSIVE METABOLIC PANEL - Abnormal; Notable for the following components:      Result Value   Glucose, Bld 103 (*)    Creatinine, Ser 1.30 (*)    Total Protein 5.7 (*)    AST 237 (*)    ALT 489 (*)    All other components within normal limits  SALICYLATE LEVEL - Abnormal; Notable for the following components:    Salicylate Lvl <7.0 (*)    All other components within normal limits  ACETAMINOPHEN LEVEL - Abnormal; Notable for the following components:   Acetaminophen (Tylenol), Serum <10 (*)    All other components within normal limits  RAPID URINE DRUG SCREEN, HOSP PERFORMED - Abnormal; Notable for the following components:   Cocaine POSITIVE (*)    All other components within normal limits  ETHANOL  CBC WITH DIFFERENTIAL/PLATELET  URINALYSIS, ROUTINE W REFLEX MICROSCOPIC  TSH  SEDIMENTATION RATE  RPR  HIV ANTIBODY (ROUTINE TESTING W REFLEX)  HSV(HERPES SIMPLEX VRS) I + II AB-IGG  MUMPS ANTIBODY, IGG  CMV ANTIBODY, IGG (EIA)  RUBELLA ANTIBODY, IGM  CBG MONITORING, ED    EKG EKG Interpretation  Date/Time:  Saturday July 01 2020 07:42:58 EST Ventricular Rate:  55 PR Interval:    QRS Duration: 87 QT Interval:  472 QTC Calculation: 452 R Axis:   59 Text Interpretation: Sinus rhythm No significant change since last tracing Confirmed by Jacalyn Lefevre [phone]) on 07/01/2020 7:52:55 AM   Radiology CT Head Wo Contrast  Result Date: 06/30/2020 CLINICAL DATA:  Facial trauma. Additional history provided: Hearing loss, lump on back of head EXAM: CT HEAD WITHOUT CONTRAST TECHNIQUE: Contiguous axial images were obtained from the base of the skull through the vertex without intravenous contrast. COMPARISON:  No pertinent prior exams available for comparison. FINDINGS: Brain: Cerebral volume is normal. There is no acute intracranial hemorrhage. No demarcated cortical infarct. No extra-axial fluid collection. No evidence of intracranial mass. No midline shift. Vascular: No hyperdense vessel.  Atherosclerotic calcifications. Skull: Normal. Negative for fracture or focal lesion. Sinuses/Orbits: Visualized orbits show no acute finding. Mild bilateral ethmoid, sphenoid and right maxillary sinus mucosal thickening at the imaged levels. IMPRESSION: No evidence of acute intracranial abnormality. Mild  paranasal sinus mucosal thickening, as described. Electronically Signed   By: Jackey Loge DO   On: 06/30/2020 20:23   MR BRAIN/IAC W WO CONTRAST  Result Date: 07/01/2020 CLINICAL DATA:  40 year old male status post syncope while using heroin. Awoke with hearing loss and a palpable abnormality back of head. EXAM: MRI HEAD WITHOUT AND WITH CONTRAST TECHNIQUE: Multiplanar, multiecho pulse sequences of the brain and  surrounding structures were obtained without and with intravenous contrast. CONTRAST:  10mL GADAVIST GADOBUTROL 1 MMOL/ML IV SOLN COMPARISON:  Head CT without contrast 06/30/2020. FINDINGS: Study is intermittently degraded by motion artifact despite repeated imaging attempts. Brain: No restricted diffusion to suggest acute infarction. No midline shift, mass effect, evidence of mass lesion, ventriculomegaly, extra-axial collection or acute intracranial hemorrhage. Cervicomedullary junction and pituitary are within normal limits. Cerebral volume appears within normal limits. Scattered small T2 and FLAIR hyperintense foci in the cerebral white matter, mostly the frontal lobes and somewhat greater on the left (series 5, image 18). Extent is mild to moderate for age. No superimposed cortical encephalomalacia or chronic cerebral blood products. Otherwise gray and white matter signal appears normal including in the deep gray matter nuclei, brainstem and cerebellum. No abnormal enhancement identified. No dural thickening is evident. Vascular: Major intracranial vascular flow voids are preserved. The major dural venous sinuses are enhancing and appear to be patent. Skull and upper cervical spine: Negative visible cervical spine. Visualized bone marrow signal is within normal limits. 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 Sinuses/Orbits: Visualized orbit  soft tissues are within normal limits. Trace paranasal sinus mucosal thickening. Other: Posterior vertex scalp hematoma suspected on series 2, image 14. Dedicated internal auditory canal imaging. Normal cerebellopontine angles. Normal bilateral cisternal and intracanalicular 7th and 8th cranial nerve segments. But evidence of the proximal IAC bony stenosis is again noted (on the right series 9, image 64). T2 signal in the bilateral cochlea and vestibular structures appears symmetric and normal. Mastoids are clear. Stylomastoid foramen appear normal. No abnormal enhancement identified. IMPRESSION: 1. 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 This likely could predispose to 7th/8th nerve injury in the setting of a deceleration force, and might be the most plausible explanation if there is bilateral hearing loss in this case. 2. Mild posterior vertex scalp hematoma. 3. No acute intracranial abnormality. Mildly to moderately advanced for age nonspecific cerebral white matter signal changes. Electronically Signed   By: Odessa FlemingH  Hall M.D.   On: 07/01/2020 10:53    Procedures Procedures   Medications Ordered in ED Medications  sodium chloride 0.9 % bolus 1,000 mL (0 mLs Intravenous Stopped 07/01/20 1023)    And  0.9 %  sodium chloride infusion ( Intravenous New Bag/Given 07/01/20 0746)  gadobutrol (GADAVIST) 1 MMOL/ML injection 10 mL (10 mLs Intravenous Contrast Given 07/01/20 1025)    ED Course  I have reviewed the triage vital signs and the nursing notes.  Pertinent labs & imaging results that were available during my care of the patient were reviewed by me and considered in my medical decision making (see chart for details).    MDM Rules/Calculators/A&P                         I spoke with Dr. Jenne PaneBates (ENT).  He recommended steroids and outpatient f/u for a hearing assessment.  Nothing else acute to  do now.  Pt is also encouraged to avoid opiates and is given a resource guide for outpatient detox.    He is given a refill on his bp meds.  Pt is to return if worse.    Final Clinical Impression(s) / ED Diagnoses Final diagnoses:  Bilateral hearing loss,  unspecified hearing loss type  Opiate abuse, continuous (HCC)    Rx / DC Orders ED Discharge Orders         Ordered    predniSONE (STERAPRED UNI-PAK 21 TAB) 10 MG (21) TBPK tablet        07/01/20 1124    lisinopril (ZESTRIL) 30 MG tablet  Daily        07/01/20 1124           Jacalyn Lefevre, MD 07/01/20 1127

## 2020-07-01 NOTE — ED Notes (Signed)
ED Provider at bedside. 

## 2020-07-02 LAB — RPR: RPR Ser Ql: NONREACTIVE

## 2020-07-03 LAB — MUMPS ANTIBODY, IGG: Mumps IgG: <9 AU/mL — ABNORMAL LOW (ref >10.9)

## 2020-07-03 LAB — HSV(HERPES SIMPLEX VRS) I + II AB-IGG
HSV 1 Glycoprotein G Ab, IgG: 23.6 index — ABNORMAL HIGH (ref 0.00–0.90)
HSV 2 Glycoprotein G Ab, IgG: <0.91 index (ref 0.00–0.90)

## 2020-07-03 LAB — RUBELLA ANTIBODY, IGM: Rubella IgM: <20 AU/mL (ref 0.0–19.9)

## 2020-07-03 LAB — CMV ANTIBODY, IGG (EIA): CMV Ab - IgG: <0.6 U/mL (ref 0.00–0.59)

## 2020-07-21 ENCOUNTER — Other Ambulatory Visit: Payer: Self-pay

## 2020-07-21 ENCOUNTER — Emergency Department (HOSPITAL_COMMUNITY)
Admission: EM | Admit: 2020-07-21 | Discharge: 2020-07-22 | Disposition: A | Discharge status: Other Acute Inpatient Hospital | Payer: Self-pay | Attending: Emergency Medicine | Admitting: Emergency Medicine

## 2020-07-21 DIAGNOSIS — I10 Essential (primary) hypertension: Secondary | ICD-10-CM | POA: Insufficient documentation

## 2020-07-21 DIAGNOSIS — F1910 Other psychoactive substance abuse, uncomplicated: Secondary | ICD-10-CM | POA: Insufficient documentation

## 2020-07-21 DIAGNOSIS — Z20822 Contact with and (suspected) exposure to covid-19: Secondary | ICD-10-CM | POA: Insufficient documentation

## 2020-07-21 DIAGNOSIS — Z79899 Other long term (current) drug therapy: Secondary | ICD-10-CM | POA: Insufficient documentation

## 2020-07-21 DIAGNOSIS — Z87891 Personal history of nicotine dependence: Secondary | ICD-10-CM | POA: Insufficient documentation

## 2020-07-21 DIAGNOSIS — F332 Major depressive disorder, recurrent severe without psychotic features: Secondary | ICD-10-CM | POA: Insufficient documentation

## 2020-07-21 DIAGNOSIS — T401X2A Poisoning by heroin, intentional self-harm, initial encounter: Secondary | ICD-10-CM

## 2020-07-21 LAB — COMPREHENSIVE METABOLIC PANEL
ALT: 22 U/L (ref 0–44)
AST: 19 U/L (ref 15–41)
Albumin: 4.1 g/dL (ref 3.5–5.0)
Alkaline Phosphatase: 67 U/L (ref 38–126)
Anion gap: 11 (ref 5–15)
BUN: 17 mg/dL (ref 6–20)
CO2: 27 mmol/L (ref 22–32)
Calcium: 9.4 mg/dL (ref 8.9–10.3)
Chloride: 98 mmol/L (ref 98–111)
Creatinine, Ser: 1.42 mg/dL — ABNORMAL HIGH (ref 0.61–1.24)
GFR, Estimated: >60 mL/min (ref >60)
Glucose, Bld: 159 mg/dL — ABNORMAL HIGH (ref 70–99)
Potassium: 5 mmol/L (ref 3.5–5.1)
Sodium: 136 mmol/L (ref 135–145)
Total Bilirubin: 0.8 mg/dL (ref 0.3–1.2)
Total Protein: 7.2 g/dL (ref 6.5–8.1)

## 2020-07-21 LAB — CBC
HCT: 45 % (ref 39.0–52.0)
Hemoglobin: 15.5 g/dL (ref 13.0–17.0)
MCH: 30.5 pg (ref 26.0–34.0)
MCHC: 34.4 g/dL (ref 30.0–36.0)
MCV: 88.6 fL (ref 80.0–100.0)
Platelets: 250 10*3/uL (ref 150–400)
RBC: 5.08 MIL/uL (ref 4.22–5.81)
RDW: 11.9 % (ref 11.5–15.5)
WBC: 7.9 10*3/uL (ref 4.0–10.5)
nRBC: 0 % (ref 0.0–0.2)

## 2020-07-21 LAB — SALICYLATE LEVEL: Salicylate Lvl: <7 mg/dL — ABNORMAL LOW (ref 7.0–30.0)

## 2020-07-21 LAB — CBG MONITORING, ED: Glucose-Capillary: 151 mg/dL — ABNORMAL HIGH (ref 70–99)

## 2020-07-21 LAB — ETHANOL: Alcohol, Ethyl (B): <10 mg/dL (ref <10)

## 2020-07-21 LAB — ACETAMINOPHEN LEVEL: Acetaminophen (Tylenol), Serum: <10 ug/mL — ABNORMAL LOW (ref 10–30)

## 2020-07-21 NOTE — ED Notes (Signed)
PT AWARE OF URINE SAMPLE. URINAL IN HAND

## 2020-07-21 NOTE — ED Notes (Signed)
Agree with triage note. Vitals taken. A&Ox4. Respirations regular/unlabored. Connected to cardiac monitor, pulse ox, bp. Stretcher low, wheels locked, call bell within reach.

## 2020-07-21 NOTE — ED Provider Notes (Signed)
[facility]-EMERGENCY DEPT Provider Note   CSN: [id number] Arrival date & time: 07/21/20  2115     History Chief Complaint  Patient presents with  . Drug Overdose    E[facility] is a 40 y.o. male.  The history is provided by the patient and medical records.  Drug Overdose   40 y.o. M with hx of HTN, presenting to the ED after being found in the front yard unresponsive.  He was found by roommate reportedly.  Patient states he does not know what happened-- states he just went outside to smoke a cigarette and does not remember what happened after that.  He did admit to snorting "something" but does not disclose what.  He was given narcan PTA with good responsive.  Currently, patient is awake, alert, asking for water  He denies any other co-ingestion.  He is allegedly currently in rehab for ongoing drug use.  He denies SI/HI/AVH.  Past Medical History:  Diagnosis Date  . Hypertension     There are no problems to display for this patient.   No past surgical history on file.     No family history on file.  Social History   Tobacco Use  . Smoking status: Former Games developer  . Smokeless tobacco: Never Used  Vaping Use  . Vaping Use: Never used  Substance Use Topics  . Alcohol use: Not Currently    Comment: not in the past 30 days   . Drug use: Not Currently    Comment: not in the past 30 days     Home Medications Prior to Admission medications   Medication Sig Start Date End Date Taking? Authorizing Provider  lisinopril (ZESTRIL) 30 MG tablet Take 1 tablet (30 mg total) by mouth daily. 07/01/20   Jacalyn Lefevre, MD  meloxicam (MOBIC) 7.5 MG tablet Take by mouth. 12/13/19   [provider]  predniSONE (STERAPRED UNI-PAK 21 TAB) 10 MG (21) TBPK tablet Take 6 tabs for 2 days, then 5 for 2 days, then 4 for 2 days, then 3 for 2 days, 2 for 2 days, then 1 for 2 days 07/01/20   Jacalyn Lefevre, MD    Allergies    Patient has no known  allergies.  Review of Systems   Review of Systems  Psychiatric/Behavioral:       Ingestion  All other systems reviewed and are negative.   Physical Exam Updated Vital Signs BP (!) 214/130 (BP Location: Left Arm)   Pulse 79   Temp 97.8 F (36.6 C) (Oral)   Resp 19   Ht 6' (1.829 m)   Wt 99.8 kg   SpO2 99%   BMI 29.84 kg/m   Physical Exam Vitals and nursing note reviewed.  Constitutional:      Appearance: He is well-developed and well-nourished.  HENT:     Head: Normocephalic and atraumatic.     Comments: No visible head trauma    Ears:     Comments: No hemotympanum    Mouth/Throat:     Mouth: Oropharynx is clear and moist.  Eyes:     Extraocular Movements: EOM normal.     Conjunctiva/sclera: Conjunctivae normal.     Pupils: Pupils are equal, round, and reactive to light.  Cardiovascular:     Rate and Rhythm: Normal rate and regular rhythm.     Heart sounds: Normal heart sounds.  Pulmonary:     Effort: Pulmonary effort is normal.     Breath sounds: Normal breath sounds.  Abdominal:     General: Bowel sounds are normal.     Palpations: Abdomen is soft.  Musculoskeletal:        General: Normal range of motion.     Cervical back: Normal range of motion.  Skin:    General: Skin is warm and dry.  Neurological:     Mental Status: He is alert and oriented to person, place, and time.     Comments: Awake, alert, able to answer questions and follow commands  Psychiatric:        Mood and Affect: Mood and affect normal.     ED Results / Procedures / Treatments   Labs (all labs ordered are listed, but only abnormal results are displayed) Labs Reviewed  COMPREHENSIVE METABOLIC PANEL - Abnormal; Notable for the following components:      Result Value   Glucose, Bld 159 (*)    Creatinine, Ser 1.42 (*)    All other components within normal limits  SALICYLATE LEVEL - Abnormal; Notable for the following components:   Salicylate Lvl <7.0 (*)    All other components  within normal limits  ACETAMINOPHEN LEVEL - Abnormal; Notable for the following components:   Acetaminophen (Tylenol), Serum <10 (*)    All other components within normal limits  RAPID URINE DRUG SCREEN, HOSP PERFORMED - Abnormal; Notable for the following components:   Opiates POSITIVE (*)    Cocaine POSITIVE (*)    All other components within normal limits  CBG MONITORING, ED - Abnormal; Notable for the following components:   Glucose-Capillary 151 (*)    All other components within normal limits  RESP PANEL BY RT-PCR (FLU A&B, COVID) ARPGX2  ETHANOL  CBC    EKG None  Radiology No results found.  Procedures Procedures Medications Ordered in ED Medications - No data to display  ED Course  I have reviewed the triage vital signs and the nursing notes.  Pertinent labs & imaging results that were available during my care of the patient were reviewed by me and considered in my medical decision making (see chart for details).    MDM Rules/Calculators/A&P  40 y.o. M here after OD at home.  He admits he "snorted something" (did not disclose what) at home and went outside to smoke and was found down in the yard.  Responded to narcan with EMS, AAOx3 on arrival.  He denies active SI/HI/AVH.  No physical complaints at present  Besides wanting water.  Labs pending.  Will monitor.  12:45 AM Labs reassuring.  UDS still pending.  Patient reassessed, continues to feel well.  He has been observed here for 3.5 hours and has not required additional Narcan here.  He states he is ready to go to rehab.  I have informed him that we do not set this up nor provide this service here in the ER but I can get him outpatient information for this.  He states he was told by EMS that we can help do that here.  He states he does not have a ride or any money to get to detox program.  When informed he would have to set this up himself as an outpatient he began stating "well, I mean I just tried to kill myself....  i'm not suicidal but that is what happened".  This was not what he reported initially and denies current SI/HI.  I have informed him that I'm happy to get a psychiatric consult, however they do not facilitate detox programs either, and if  cleared from psychiatric standpoint he will still need to set up his own detox/rehab as an OP.  He requested to talk to psychiatry.  TTS has evaluated, recommends repeat eval with psychiatry in the AM.  Final Clinical Impression(s) / ED Diagnoses Final diagnoses:  Substance abuse [facility])    Rx / DC Orders ED Discharge Orders    None       Garlon Hatchet, PA-C 07/22/20 0440    Gilda Crease, MD 07/22/20 [phone]

## 2020-07-21 NOTE — ED Triage Notes (Signed)
Pt via ems drug overdose found in front yard unresponsive for unknown amount of time. Pt currently in rehab for drugs. 1 narcan given to pt PTA. Pt reports chest pain due to sternal rub by ems. Unknown drug. Drug was snorted.

## 2020-07-22 ENCOUNTER — Inpatient Hospital Stay (HOSPITAL_COMMUNITY)
Admission: RE | Admit: 2020-07-22 | Discharge: 2020-07-27 | DRG: 897 | Disposition: A | Discharge status: Home / Self Care | Payer: Federal, State, Local not specified - Other | Source: Intra-hospital | Attending: Psychiatry | Admitting: Psychiatry

## 2020-07-22 ENCOUNTER — Other Ambulatory Visit: Payer: Self-pay

## 2020-07-22 ENCOUNTER — Encounter (HOSPITAL_COMMUNITY): Payer: Self-pay | Admitting: Family

## 2020-07-22 DIAGNOSIS — T401X2A Poisoning by heroin, intentional self-harm, initial encounter: Secondary | ICD-10-CM | POA: Diagnosis present

## 2020-07-22 DIAGNOSIS — G4700 Insomnia, unspecified: Secondary | ICD-10-CM | POA: Diagnosis present

## 2020-07-22 DIAGNOSIS — F14280 Cocaine dependence with cocaine-induced anxiety disorder: Secondary | ICD-10-CM | POA: Diagnosis not present

## 2020-07-22 DIAGNOSIS — F332 Major depressive disorder, recurrent severe without psychotic features: Secondary | ICD-10-CM | POA: Diagnosis present

## 2020-07-22 DIAGNOSIS — F1110 Opioid abuse, uncomplicated: Secondary | ICD-10-CM | POA: Diagnosis not present

## 2020-07-22 DIAGNOSIS — F14980 Cocaine use, unspecified with cocaine-induced anxiety disorder: Secondary | ICD-10-CM | POA: Diagnosis present

## 2020-07-22 DIAGNOSIS — Z87891 Personal history of nicotine dependence: Secondary | ICD-10-CM | POA: Diagnosis not present

## 2020-07-22 DIAGNOSIS — F1123 Opioid dependence with withdrawal: Secondary | ICD-10-CM | POA: Diagnosis present

## 2020-07-22 DIAGNOSIS — G8929 Other chronic pain: Secondary | ICD-10-CM | POA: Diagnosis present

## 2020-07-22 DIAGNOSIS — T50902A Poisoning by unspecified drugs, medicaments and biological substances, intentional self-harm, initial encounter: Principal | ICD-10-CM | POA: Diagnosis present

## 2020-07-22 DIAGNOSIS — G894 Chronic pain syndrome: Secondary | ICD-10-CM | POA: Diagnosis not present

## 2020-07-22 DIAGNOSIS — H9312 Tinnitus, left ear: Secondary | ICD-10-CM | POA: Diagnosis present

## 2020-07-22 DIAGNOSIS — Z9151 Personal history of suicidal behavior: Secondary | ICD-10-CM

## 2020-07-22 DIAGNOSIS — I10 Essential (primary) hypertension: Secondary | ICD-10-CM | POA: Diagnosis present

## 2020-07-22 DIAGNOSIS — T50902D Poisoning by unspecified drugs, medicaments and biological substances, intentional self-harm, subsequent encounter: Secondary | ICD-10-CM | POA: Diagnosis not present

## 2020-07-22 LAB — RAPID URINE DRUG SCREEN, HOSP PERFORMED
Amphetamines: NOT DETECTED
Barbiturates: NOT DETECTED
Benzodiazepines: NOT DETECTED
Cocaine: POSITIVE — AB
Opiates: POSITIVE — AB
Tetrahydrocannabinol: NOT DETECTED

## 2020-07-22 LAB — RESP PANEL BY RT-PCR (FLU A&B, COVID) ARPGX2
Influenza A by PCR: NEGATIVE
Influenza B by PCR: NEGATIVE
SARS Coronavirus 2 by RT PCR: NEGATIVE

## 2020-07-22 MED ORDER — HYDROXYZINE HCL 25 MG PO TABS: 25.0000 mg | ORAL_TABLET | Freq: Four times a day (QID) | ORAL | Status: DC | PRN

## 2020-07-22 MED ORDER — LISINOPRIL 20 MG PO TABS
30.0000 mg | ORAL_TABLET | Freq: Once | ORAL | Status: DC
  Filled 2020-07-22: qty 1
  Filled 2020-07-22: qty 2

## 2020-07-22 MED ORDER — LOPERAMIDE HCL 2 MG PO CAPS: 2.0000 mg | ORAL_CAPSULE | ORAL | Status: DC | PRN

## 2020-07-22 MED ORDER — ALUM & MAG HYDROXIDE-SIMETH 200-200-20 MG/5ML PO SUSP: 30.0000 mL | ORAL | Status: DC | PRN

## 2020-07-22 MED ORDER — ONDANSETRON 4 MG PO TBDP: 4.0000 mg | ORAL_TABLET | Freq: Four times a day (QID) | ORAL | Status: DC | PRN

## 2020-07-22 MED ORDER — MAGNESIUM HYDROXIDE 400 MG/5ML PO SUSP: 30.0000 mL | Freq: Every day | ORAL | Status: DC | PRN

## 2020-07-22 MED ORDER — CLONIDINE HCL 0.1 MG PO TABS: 0.1000 mg | ORAL_TABLET | Freq: Every day | ORAL | Status: DC

## 2020-07-22 MED ORDER — DICYCLOMINE HCL 20 MG PO TABS: 20.0000 mg | ORAL_TABLET | Freq: Four times a day (QID) | ORAL | Status: DC | PRN

## 2020-07-22 MED ORDER — CLONIDINE HCL 0.1 MG PO TABS
0.1000 mg | ORAL_TABLET | Freq: Every day | ORAL | Status: DC
  Administered 2020-07-27: 0.1 mg via ORAL
  Filled 2020-07-22 (×2): qty 1

## 2020-07-22 MED ORDER — CLONIDINE HCL 0.1 MG PO TABS
0.1000 mg | ORAL_TABLET | Freq: Four times a day (QID) | ORAL | Status: DC
  Administered 2020-07-22: 0.1 mg via ORAL
  Filled 2020-07-22: qty 1

## 2020-07-22 MED ORDER — CLONIDINE HCL 0.1 MG PO TABS: 0.1000 mg | ORAL_TABLET | ORAL | Status: DC

## 2020-07-22 MED ORDER — NICOTINE POLACRILEX 2 MG MT GUM
CHEWING_GUM | OROMUCOSAL | Status: AC
  Administered 2020-07-22: 2 mg
  Filled 2020-07-22: qty 1

## 2020-07-22 MED ORDER — NICOTINE POLACRILEX 2 MG MT GUM
2.0000 mg | CHEWING_GUM | OROMUCOSAL | Status: DC | PRN
  Administered 2020-07-23 – 2020-07-26 (×12): 2 mg via ORAL
  Filled 2020-07-22 (×3): qty 1

## 2020-07-22 MED ORDER — LISINOPRIL 20 MG PO TABS
30.0000 mg | ORAL_TABLET | Freq: Once | ORAL | Status: AC
  Administered 2020-07-22: 30 mg via ORAL
  Filled 2020-07-22: qty 1

## 2020-07-22 MED ORDER — NAPROXEN 500 MG PO TABS
500.0000 mg | ORAL_TABLET | Freq: Two times a day (BID) | ORAL | Status: DC | PRN
  Administered 2020-07-23: 500 mg via ORAL
  Filled 2020-07-22: qty 1

## 2020-07-22 MED ORDER — METHOCARBAMOL 500 MG PO TABS: 500.0000 mg | ORAL_TABLET | Freq: Three times a day (TID) | ORAL | Status: DC | PRN

## 2020-07-22 MED ORDER — DICYCLOMINE HCL 20 MG PO TABS
20.0000 mg | ORAL_TABLET | Freq: Four times a day (QID) | ORAL | Status: DC | PRN
  Administered 2020-07-22 – 2020-07-24 (×2): 20 mg via ORAL
  Filled 2020-07-22 (×2): qty 1

## 2020-07-22 MED ORDER — HYDROXYZINE HCL 25 MG PO TABS
25.0000 mg | ORAL_TABLET | Freq: Four times a day (QID) | ORAL | Status: DC | PRN
  Administered 2020-07-23 – 2020-07-26 (×3): 25 mg via ORAL
  Filled 2020-07-22 (×4): qty 1

## 2020-07-22 MED ORDER — METHOCARBAMOL 500 MG PO TABS
500.0000 mg | ORAL_TABLET | Freq: Three times a day (TID) | ORAL | Status: DC | PRN
  Administered 2020-07-22 – 2020-07-26 (×7): 500 mg via ORAL
  Filled 2020-07-22 (×7): qty 1

## 2020-07-22 MED ORDER — CLONIDINE HCL 0.1 MG PO TABS
0.1000 mg | ORAL_TABLET | Freq: Four times a day (QID) | ORAL | Status: AC
  Administered 2020-07-22 – 2020-07-24 (×7): 0.1 mg via ORAL
  Filled 2020-07-22 (×13): qty 1

## 2020-07-22 MED ORDER — TRAZODONE HCL 50 MG PO TABS
50.0000 mg | ORAL_TABLET | Freq: Every evening | ORAL | Status: DC | PRN
  Administered 2020-07-22 – 2020-07-27 (×6): 50 mg via ORAL
  Filled 2020-07-22 (×15): qty 1

## 2020-07-22 MED ORDER — ACETAMINOPHEN 325 MG PO TABS: 650.0000 mg | ORAL_TABLET | Freq: Four times a day (QID) | ORAL | Status: DC | PRN

## 2020-07-22 MED ORDER — NAPROXEN 500 MG PO TABS: 500.0000 mg | ORAL_TABLET | Freq: Two times a day (BID) | ORAL | Status: DC | PRN

## 2020-07-22 MED ORDER — CLONIDINE HCL 0.1 MG PO TABS
0.1000 mg | ORAL_TABLET | ORAL | Status: AC
  Administered 2020-07-24 – 2020-07-26 (×4): 0.1 mg via ORAL
  Filled 2020-07-22 (×4): qty 1

## 2020-07-22 NOTE — ED Notes (Addendum)
Patient belongings (shirt, pants, cell phone) collected/labeled in patient belongings bags and stored in cabinet. Pt placed in scrubs and yellow socks. In front of nurses station.

## 2020-07-22 NOTE — ED Notes (Addendum)
Pt reports ems was given home dose lisinopril medications by facility patient was staying at. EMS did not hand any medications to ED staff.

## 2020-07-22 NOTE — Progress Notes (Signed)
D: Patient is guarded and minimal upon interaction. Patient reports having a bad day but is cooperative with assessment. Patient denies SI/HI at this time. Patient also denies AH/VH at this time. Patient contracts for safety.  A: Provided positive reinforcement and encouragement.  R: Patient cooperative and receptive to efforts. Patient remains safe on the unit.   07/22/20 2105  Psych Admission Type (Psych Patients Only)  Admission Status Voluntary  Psychosocial Assessment  Patient Complaints Substance abuse  Eye Contact Brief  Facial Expression Pensive;Pained  Affect Appropriate to circumstance  Speech Logical/coherent  Interaction Assertive  Motor Activity Fidgety  Appearance/Hygiene Unremarkable  Behavior Characteristics Cooperative;Appropriate to situation  Mood Anxious  Thought Process  Coherency WDL  Content WDL  Delusions None reported or observed  Perception WDL  Hallucination None reported or observed  Judgment Impaired  Confusion None  Danger to Self  Current suicidal ideation? Denies  Danger to Others  Danger to Others None reported or observed
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
## 2020-07-22 NOTE — Progress Notes (Signed)
Admission Note: Patient is a 40 year old male admitted to the unit from [facility] for symptoms of depression, suicidal ideation and substance abuse .  Patient found outside in yard unresponsive.  Patient had snorted half a gram of heroine in an attempt to end his life.  UDS was positive for opiates and cocaine.  States he is here to get into a substance abuse program and mental stability.  Patient presents with anxious affect and mood.  Admission plan of care reviewed and consent for treatment signed.  Skin assessment and personal belongings completed.  Skin is dry and intact.  No contraband found.  Patient oriented to the unit, staff and room.  Routine safety checks initiated.  Verbalizes understanding of unit rules and protocols.  Patient is safe on the unit.

## 2020-07-22 NOTE — Consult Note (Signed)
[facility] Psych ED Progress Note  07/22/2020 11:48 AM Erik Campbell  MRN:  [id number] Subjective:  Patient is seen and case discussed with Dr. Lucianne Muss.  Patient reports worsening depressive symptoms to include isolation, withdrawn, guilty, hopelessness, and overall feeling that he will not be able to quit using substances.  Patient was recently released from [place] in [place], a few months ago however has recently relapsed.  Yesterday he snorted about a half a gram of heroin in an attempt to end his life.  He usually uses about 1/4-1/2 a gram a day, however took it all at once.  Urine drug screen was positive for opiates and cocaine.  He reports his heroin was most likely couple of cocaine although prior drug screen do show cocaine use.  He denies any current withdrawal symptoms, with the exception of excessive sweating.  He has a previous diagnosis of major depression, polysubstance abuse, and 3 previous suicide attempts by overdose within the last year.  Patient is alert and oriented, calm and cooperative, very pleasant.  Patient continues to endorse depressive symptoms to include suicidal ideations while in the emergency room.  He expresses some difficulty with contracting for safety, as he feels very hopeless.  Patient does not appear to be responding to internal or external stimuli.  He does not display any delusional thinking, as he is able to answer questions appropriately.  Patient continues to express suicidal intent, and is a high risk for suicide completion at this time.  Patient will benefit from inpatient hospitalization and medication management.   Principal Problem: <principal problem not specified> Diagnosis:  Active Problems:   * No active hospital problems. *  Total Time spent with patient: 30 minutes  Past Psychiatric History:   Past Medical History:  Past Medical History:  Diagnosis Date  . Hypertension    No past surgical history on file. Family History: No family history on  file. Family Psychiatric  History:  Social History:  Social History   Substance and Sexual Activity  Alcohol Use Not Currently   Comment: not in the past 30 days      Social History   Substance and Sexual Activity  Drug Use Not Currently   Comment: not in the past 30 days     Social History   Socioeconomic History  . Marital status: Single    Spouse name: Not on file  . Number of children: Not on file  . Years of education: Not on file  . Highest education level: Not on file  Occupational History  . Not on file  Tobacco Use  . Smoking status: Former Games developer  . Smokeless tobacco: Never Used  Vaping Use  . Vaping Use: Never used  Substance and Sexual Activity  . Alcohol use: Not Currently    Comment: not in the past 30 days   . Drug use: Not Currently    Comment: not in the past 30 days   . Sexual activity: Not Currently    Comment: not in the past 30 days   Other Topics Concern  . Not on file  Social History Narrative  . Not on file   Social Determinants of Health   Financial Resource Strain: Not on file  Food Insecurity: Not on file  Transportation Needs: Not on file  Physical Activity: Not on file  Stress: Not on file  Social Connections: Not on file    Sleep: Fair  Appetite:  Fair  Current Medications: No current facility-administered medications for this  encounter.   Current Outpatient Medications  Medication Sig Dispense Refill  . lisinopril (ZESTRIL) 30 MG tablet Take 1 tablet (30 mg total) by mouth daily. 30 tablet 0    Lab Results:  Results for orders placed or performed during the hospital encounter of 07/21/20 (from the past 48 hour(s))  CBG monitoring, ED     Status: Abnormal   Collection Time: 07/21/20  9:49 PM  Result Value Ref Range   Glucose-Capillary 151 (H) 70 - 99 mg/dL    Comment: Glucose reference range applies only to samples taken after fasting for at least 8 hours.  Comprehensive metabolic panel     Status: Abnormal    Collection Time: 07/21/20  9:50 PM  Result Value Ref Range   Sodium 136 135 - 145 mmol/L   Potassium 5.0 3.5 - 5.1 mmol/L   Chloride 98 98 - 111 mmol/L   CO2 27 22 - 32 mmol/L   Glucose, Bld 159 (H) 70 - 99 mg/dL    Comment: Glucose reference range applies only to samples taken after fasting for at least 8 hours.   BUN 17 6 - 20 mg/dL   Creatinine, Ser 1.61 (H) 0.61 - 1.24 mg/dL   Calcium 9.4 8.9 - 09.6 mg/dL   Total Protein 7.2 6.5 - 8.1 g/dL   Albumin 4.1 3.5 - 5.0 g/dL   AST 19 15 - 41 U/L   ALT 22 0 - 44 U/L   Alkaline Phosphatase 67 38 - 126 U/L   Total Bilirubin 0.8 0.3 - 1.2 mg/dL   GFR, Estimated >04 >54 mL/min    Comment: (NOTE) Calculated using the CKD-EPI Creatinine Equation (2021)    Anion gap 11 5 - 15    Comment: Performed at [facility], [address], [place], [place] [address]  Ethanol     Status: None   Collection Time: 07/21/20  9:50 PM  Result Value Ref Range   Alcohol, Ethyl (B) <10 <10 mg/dL    Comment: (NOTE) Lowest detectable limit for serum alcohol is 10 mg/dL.  For medical purposes only. Performed at [facility], [address], [place], [place] [address]   Salicylate level     Status: Abnormal   Collection Time: 07/21/20  9:50 PM  Result Value Ref Range   Salicylate Lvl <7.0 (L) 7.0 - 30.0 mg/dL    Comment: Performed at [facility], [address] [place], [place] [address]  Acetaminophen level     Status: Abnormal   Collection Time: 07/21/20  9:50 PM  Result Value Ref Range   Acetaminophen (Tylenol), Serum <10 (L) 10 - 30 ug/mL    Comment: (NOTE) Therapeutic concentrations vary significantly. A range of 10-30 ug/mL  may be an effective concentration for many patients. However, some  are best treated at concentrations outside of this range. Acetaminophen concentrations >150 ug/mL at 4 hours after ingestion  and >50 ug/mL at 12 hours after ingestion are often associated with  toxic  reactions.  Performed at [facility], [address], [place], [place] [address]   cbc     Status: None   Collection Time: 07/21/20  9:50 PM  Result Value Ref Range   WBC 7.9 4.0 - 10.5 K/uL   RBC 5.08 4.22 - 5.81 MIL/uL   Hemoglobin 15.5 13.0 - 17.0 g/dL   HCT 57.8 46.9 - 62.9 %   MCV 88.6 80.0 - 100.0 fL   MCH 30.5 26.0 - 34.0 pg  MCHC 34.4 30.0 - 36.0 g/dL   RDW 47.8 29.5 - 62.1 %   Platelets 250 150 - 400 K/uL   nRBC 0.0 0.0 - 0.2 %    Comment: Performed at [facility], [address] [place], [place] [address]  Rapid urine drug screen (hospital performed)     Status: Abnormal   Collection Time: 07/22/20 12:53 AM  Result Value Ref Range   Opiates POSITIVE (A) NONE DETECTED   Cocaine POSITIVE (A) NONE DETECTED   Benzodiazepines NONE DETECTED NONE DETECTED   Amphetamines NONE DETECTED NONE DETECTED   Tetrahydrocannabinol NONE DETECTED NONE DETECTED   Barbiturates NONE DETECTED NONE DETECTED    Comment: (NOTE) DRUG SCREEN FOR MEDICAL PURPOSES ONLY.  IF CONFIRMATION IS NEEDED FOR ANY PURPOSE, NOTIFY LAB WITHIN 5 DAYS.  LOWEST DETECTABLE LIMITS FOR URINE DRUG SCREEN Drug Class                     Cutoff (ng/mL) Amphetamine and metabolites    1000 Barbiturate and metabolites    200 Benzodiazepine                 200 Tricyclics and metabolites     300 Opiates and metabolites        300 Cocaine and metabolites        300 THC                            50 Performed at [facility], [address] [place], [place] [address]   Resp Panel by RT-PCR (Flu A&B, Covid) Urine, Clean Catch     Status: None   Collection Time: 07/22/20 12:53 AM   Specimen: Urine, Clean Catch; Nasopharyngeal(NP) swabs in vial transport medium  Result Value Ref Range   SARS Coronavirus 2 by RT PCR NEGATIVE NEGATIVE    Comment: (NOTE) SARS-CoV-2 target nucleic acids are NOT DETECTED.  The SARS-CoV-2 RNA is generally detectable in upper  respiratory specimens during the acute phase of infection. The lowest concentration of SARS-CoV-2 viral copies this assay can detect is 138 copies/mL. A negative result does not preclude SARS-Cov-2 infection and should not be used as the sole basis for treatment or other patient management decisions. A negative result may occur with  improper specimen collection/handling, submission of specimen other than nasopharyngeal swab, presence of viral mutation(s) within the areas targeted by this assay, and inadequate number of viral copies(<138 copies/mL). A negative result must be combined with clinical observations, patient history, and epidemiological information. The expected result is Negative.  Fact Sheet for Patients:  BloggerCourse.com  Fact Sheet for Healthcare Providers:  SeriousBroker.it  This test is no t yet approved or cleared by the [place] FDA and  has been authorized for detection and/or diagnosis of SARS-CoV-2 by FDA under an Emergency Use Authorization (EUA). This EUA will remain  in effect (meaning this test can be used) for the duration of the COVID-19 declaration under Section 564(b)(1) of the Act, 21 U.S.C.section 360bbb-3(b)(1), unless the authorization is terminated  or revoked sooner.       Influenza A by PCR NEGATIVE NEGATIVE   Influenza B by PCR NEGATIVE NEGATIVE    Comment: (NOTE) The Xpert Xpress SARS-CoV-2/FLU/RSV plus assay is intended as an aid in the diagnosis of influenza from Nasopharyngeal swab specimens and should not be used as a sole basis for treatment. Nasal washings and aspirates are unacceptable for Xpert Xpress  SARS-CoV-2/FLU/RSV testing.  Fact Sheet for Patients: BloggerCourse.comhttps://www.fda.gov/media/152166/download  Fact Sheet for Healthcare Providers: SeriousBroker.ithttps://www.fda.gov/media/152162/download  This test is not yet approved or cleared by the [place] FDA and has been authorized for  detection and/or diagnosis of SARS-CoV-2 by FDA under an Emergency Use Authorization (EUA). This EUA will remain in effect (meaning this test can be used) for the duration of the COVID-19 declaration under Section 564(b)(1) of the Act, 21 U.S.C. section 360bbb-3(b)(1), unless the authorization is terminated or revoked.  Performed at [facility], [address] [place], [place] [id number]     Blood Alcohol level:  Lab Results  Component Value Date   ETH <10 07/21/2020   ETH <10 07/01/2020    Physical Findings: AIMS:  , ,  ,  ,    CIWA:    COWS:     Musculoskeletal: Strength & Muscle Tone: within normal limits Gait & Station: normal Patient leans: N/A  Psychiatric Specialty Exam: Physical Exam  Review of Systems  Blood pressure (!) 119/105, pulse 70, temperature 97.8 F (36.6 C), temperature source Oral, resp. rate 15, height 6' (1.829 m), weight 99.8 kg, SpO2 98 %.Body mass index is 29.84 kg/m.  General Appearance: Fairly Groomed  Eye Contact:  Fair  Speech:  Clear and Coherent and Normal Rate  Volume:  Normal  Mood:  Depressed and Dysphoric  Affect:  Depressed and Flat  Thought Process:  Coherent, Linear and Descriptions of Associations: Intact  Orientation:  Full (Time, Place, and Person)  Thought Content:  Logical  Suicidal Thoughts:  Yes.  with intent/plan  Homicidal Thoughts:  No  Memory:  Immediate;   Fair Recent;   Fair  Judgement:  Intact  Insight:  Present  Psychomotor Activity:  Psychomotor Retardation  Concentration:  Concentration: Fair and Attention Span: Fair  Recall:  [employer]Fair  Fund of Knowledge:  Fair  Language:  Fair  Akathisia:  No  Handed:  Right  AIMS (if indicated):     Assets:  Communication Skills Desire for Improvement Financial Resources/Insurance Leisure Time Physical Health Social Support  ADL's:  Intact  Cognition:  WNL  Sleep:         Treatment Plan Summary: Daily contact with patient to assess and  evaluate symptoms and progress in treatment, Medication management and Plan Continue COWS protocol. Recommend inpatient admission due to recent attempt to self harm. Patient is high risk for suicide completion at this time related to age, substance abuse, previous usicide attempts, gender and race.  - Recommend facilitating inpatient referral with social work. We [facility].  -Due to significant usage of herion, patient is not [facility] due to suspected withdrawal symptoms.   -COWS and Clonidine protocol initiated.   The above information has been communicated with EDP and Dr. Lucianne MussKumar via secure chat.   Maryagnes Amosakia S Starkes-Perry, FNP 07/22/2020, 11:48 AM

## 2020-07-22 NOTE — ED Notes (Signed)
Pt resting in stretcher. NAD. Respirations regular/unlabored. In view at all times.

## 2020-07-22 NOTE — Progress Notes (Signed)
Psychoeducational Group Note  Date:  07/22/2020 Time: 2015 Group Topic/Focus:  wrap up group  Participation Level: Did Not Attend  Participation Quality:  Not Applicable  Affect:  Not Applicable  Cognitive:  Not Applicable  Insight:  Not Applicable  Engagement in Group: Not Applicable  Additional Comments:  Pt did not attend wrap up group.   Marcille Buffy 07/22/2020, 9:29 PM

## 2020-07-22 NOTE — ED Notes (Signed)
Pt ambulatory to restroom. Pt given breakfast tray. Pt calm and cooperative sitting up in bed eating.

## 2020-07-23 DIAGNOSIS — T50902D Poisoning by unspecified drugs, medicaments and biological substances, intentional self-harm, subsequent encounter: Secondary | ICD-10-CM | POA: Diagnosis not present

## 2020-07-23 DIAGNOSIS — T50902A Poisoning by unspecified drugs, medicaments and biological substances, intentional self-harm, initial encounter: Secondary | ICD-10-CM | POA: Diagnosis present

## 2020-07-23 DIAGNOSIS — F332 Major depressive disorder, recurrent severe without psychotic features: Secondary | ICD-10-CM

## 2020-07-23 DIAGNOSIS — G894 Chronic pain syndrome: Secondary | ICD-10-CM

## 2020-07-23 DIAGNOSIS — F1110 Opioid abuse, uncomplicated: Secondary | ICD-10-CM | POA: Diagnosis not present

## 2020-07-23 DIAGNOSIS — F14280 Cocaine dependence with cocaine-induced anxiety disorder: Secondary | ICD-10-CM | POA: Diagnosis not present

## 2020-07-23 DIAGNOSIS — G8929 Other chronic pain: Secondary | ICD-10-CM | POA: Diagnosis present

## 2020-07-23 MED ORDER — BUPROPION HCL ER (XL) 150 MG PO TB24
150.0000 mg | ORAL_TABLET | Freq: Every day | ORAL | Status: DC
  Administered 2020-07-23 – 2020-07-26 (×4): 150 mg via ORAL
  Filled 2020-07-23 (×7): qty 1

## 2020-07-23 MED ORDER — GABAPENTIN 300 MG PO CAPS
600.0000 mg | ORAL_CAPSULE | Freq: Three times a day (TID) | ORAL | Status: DC
Start: 2020-07-23 — End: 2020-07-26
  Administered 2020-07-23 – 2020-07-26 (×7): 600 mg via ORAL
  Filled 2020-07-23 (×16): qty 2

## 2020-07-23 NOTE — Progress Notes (Signed)
[place] NOVEL CORONAVIRUS (COVID-19) DAILY CHECK-OFF SYMPTOMS - answer yes or no to each - every day NO YES  Have you had a fever in the past 24 hours?  . Fever (Temp > 37.80C / 100F) X   Have you had any of these symptoms in the past 24 hours? . New Cough .  Sore Throat  .  Shortness of Breath .  Difficulty Breathing .  Unexplained Body Aches   X   Have you had any one of these symptoms in the past 24 hours not related to allergies?   . Runny Nose .  Nasal Congestion .  Sneezing   X   If you have had runny nose, nasal congestion, sneezing in the past 24 hours, has it worsened?  X   EXPOSURES - check yes or no X   Have you traveled outside the state in the past 14 days?  X   Have you been in contact with someone with a confirmed diagnosis of COVID-19 or PUI in the past 14 days without wearing appropriate PPE?  X   Have you been living in the same home as a person with confirmed diagnosis of COVID-19 or a PUI (household contact)?    X   Have you been diagnosed with COVID-19?    X              What to do next: Answered NO to all: Answered YES to anything:   Proceed with unit schedule Follow the [facility] Inpatient Flowsheet.   

## 2020-07-23 NOTE — [facility] Group Notes (Signed)
[facility] Group Notes: (Clinical Social Work)   07/23/2020      Type of Therapy:  Group Therapy   Participation Level:  Did Not Attend despite MHT prompting   Shellia Cleverly, LCSW  07/23/2020 1:34 PM

## 2020-07-23 NOTE — Progress Notes (Signed)
   07/23/20 2300  Psych Admission Type (Psych Patients Only)  Admission Status Voluntary  Psychosocial Assessment  Patient Complaints Substance abuse;Anxiety  Eye Contact Brief  Facial Expression Pensive;Pained  Affect Appropriate to circumstance  Speech Logical/coherent  Interaction Assertive  Motor Activity Fidgety  Appearance/Hygiene Unremarkable  Behavior Characteristics Cooperative;Anxious  Mood Anxious  Thought Process  Coherency WDL  Content WDL  Delusions None reported or observed  Perception WDL  Hallucination None reported or observed  Judgment Impaired  Confusion None  Danger to Self  Current suicidal ideation? Denies  Danger to Others  Danger to Others None reported or observed  Prn vistaril for anxiety and robaxin for muscle spasm given at HS and reported effective. Denies SI/HI/A/V/H. Support and encouragement provided as needed.

## 2020-07-23 NOTE — Progress Notes (Signed)
   07/23/20 0900  Psych Admission Type (Psych Patients Only)  Admission Status Voluntary  Psychosocial Assessment  Patient Complaints Substance abuse  Eye Contact Brief  Facial Expression Pensive;Pained  Affect Appropriate to circumstance  Speech Logical/coherent  Interaction Assertive  Motor Activity Fidgety  Appearance/Hygiene Unremarkable  Behavior Characteristics Cooperative;Appropriate to situation  Mood Anxious  Thought Process  Coherency WDL  Content WDL  Delusions None reported or observed  Perception WDL  Hallucination None reported or observed  Judgment Impaired  Confusion None  Danger to Self  Current suicidal ideation? Denies  Danger to Others  Danger to Others None reported or observed

## 2020-07-23 NOTE — [facility] Group Notes (Signed)
Adult Psychoeducational Group Note  Date:  07/23/2020 Time:  8:46 PM  Group Topic/Focus:  Wrap-Up Group:   The focus of this group is to help patients review their daily goal of treatment and discuss progress on daily workbooks.  Participation Level:  Active  Participation Quality:  Appropriate  Affect:  Appropriate  Cognitive:  Appropriate  Insight: Appropriate  Engagement in Group:  Engaged  Modes of Intervention:  Discussion  Additional Comments:  Patient attended wrap-up group and participated.   Saory Carriero W Maymie Brunke 07/23/2020, 8:46 PM

## 2020-07-23 NOTE — [facility] Suicide Risk Assessment (Signed)
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  I cannot stop my use of drugs."  History of present illness: Erik Campbell is a 40 y.o. male with a history of polysubstance abuse, major depressive disorder and 3 previous suicide attempts by overdose in the past year he was admitted following an opiate overdose.  Patient endorses also using cocaine.  From initial psychiatric consultation on 07/22/2020:  Patient reports worsening depressive symptoms to include isolation, withdrawn, guilty, hopelessness, and overall feeling that he will not be able to quit using substances.  Patient was recently released from [place] in [place], a few months ago however has recently relapsed.  Yesterday he snorted about a half a gram of heroin in an attempt to end his life.  He usually uses about 1/4-1/2 a gram a day, however took it all at once.  Urine drug screen was positive for opiates and cocaine.  He reports his heroin was most likely couple of cocaine although prior drug screen do show cocaine use.  He denies any current withdrawal symptoms, with the exception of excessive sweating.  He has a previous diagnosis of major depression, polysubstance abuse, and 3 previous suicide attempts by overdose within the last year. Patient is alert and oriented,  calm and cooperative, very pleasant.  Patient continues to endorse depressive symptoms to include suicidal ideations while in the emergency room.  He expresses some difficulty with contracting for safety, as he feels very hopeless.  Patient does not appear to be responding to internal or external stimuli.  He does not display any delusional thinking, as he is able to answer questions appropriately.  Patient continues to express suicidal intent, and is a high risk for suicide completion at this time.  Patient will benefit from inpatient hospitalization and medication management.  On intake history for hospital admission today, patient admits to suicide attempt.  He describes that his struggle with addiction is "never ending, and he is tired of it."  He states that his opiate addiction began in 2018 following being prescribed opiates postoperatively.  Review of records documents a right shoulder surgery in 2018 with complaints of chronic pain after that time and a repeat surgery in 2019.  Patient states that he completed a 28-day drug [facility] program [facility]/2020.  He reports that he then lived at [facility] where he was able to maintain sobriety from substances until mid January.  He admits that he began reusing heroin (snorting) around this time.  He relates this to his "mental condition" which kept him from going to meetings.  Patient endorses he had stopped going to NA meetings just prior to his relapse.  Patient reports that his mother lives in [place], [place] and is supportive of him.  However, he has not yet told his mother about this hospitalization.  Patient continues to report suicidal ideation, but is able to  contract for safety while in the hospital.   Continued Clinical Symptoms:  Alcohol Use Disorder Identification Test Final Score (AUDIT): 1 The "Alcohol Use Disorders Identification Test", Guidelines for Use in Primary Care, Second Edition.  World Environmental consultant [facility]). Score between 0-7:  no or low risk or alcohol related problems. Score between 8-15:  moderate risk of alcohol related problems. Score between 16-19:  high risk of alcohol related problems. Score 20 or above:  warrants further diagnostic evaluation for alcohol dependence and treatment.  Other substance use: Patient reports smoking 1 pack of cigarettes per day Snorts cocaine and heroin Denies other drug use to include marijuana Drink significant amounts of caffeine daily.  CLINICAL FACTORS:   Severe Anxiety and/or Agitation Depression:   Comorbid alcohol abuse/dependence Hopelessness Impulsivity Severe Alcohol/Substance Abuse/Dependencies Chronic Pain Previous Psychiatric Diagnoses and Treatments Medical Diagnoses and Treatments/Surgeries   Musculoskeletal: Strength & Muscle Tone: In bed Gait & Station: In bed Patient leans: N/A  Psychiatric Specialty Exam: Physical Exam Vitals and nursing note reviewed.  Constitutional:      Appearance: Normal appearance.     Comments: sleepy  HENT:     Head: Normocephalic and atraumatic.  Cardiovascular:     Rate and Rhythm: Normal rate.  Pulmonary:     Effort: Respiratory distress present.     Breath sounds: No wheezing.  Musculoskeletal:        General: Normal range of motion.     Cervical back: Normal range of motion.  Neurological:     General: No focal deficit present.     Mental Status: He is oriented to person, place, and time.     Review of Systems  Constitutional: Positive for chills and diaphoresis.  HENT: Negative.   Respiratory: Negative for cough, chest tightness and shortness of breath.   Cardiovascular: Negative for chest pain.  Gastrointestinal: Positive for abdominal pain (cramping).  Musculoskeletal: Positive for arthralgias, back pain and myalgias.  Neurological: Negative.   Psychiatric/Behavioral: Positive for dysphoric mood, self-injury, sleep disturbance and suicidal ideas. Negative  for agitation, behavioral problems, confusion, decreased concentration and hallucinations. The patient is nervous/anxious. The patient is not hyperactive.     Blood pressure 127/87, pulse (!) 58, temperature 97.9 F (36.6 C), temperature source Oral, resp. rate 18, height 6' (1.829 m), weight 94.8 kg, SpO2 100 %.Body mass index is 28.35 kg/m.   General Appearance: Disheveled  Eye Contact:  Minimal  Speech:  Normal Rate  Volume:  Decreased  Mood:  Depressed and Hopeless  Affect:  Congruent  Thought Process:  Descriptions of Associations: Intact  Orientation:  Full (Time, Place, and Person)  Thought Content:  Logical and Hallucinations: None  Suicidal Thoughts:  No  Homicidal Thoughts:  No  Memory:  Immediate;   Fair Recent;   Fair Remote;   Fair  Judgement:  Poor  Insight:  Shallow  Psychomotor Activity:  Psychomotor Retardation  Concentration:  Concentration: Fair  Recall:  [employer] of Knowledge:  Fair  Language:  Good  Akathisia:  No  Handed:  Right  AIMS (if indicated):     Assets:  Resilience Social Support  ADL's:  Intact  Cognition:  Impaired,  Mild  Sleep:  Number of Hours: 6.25       COGNITIVE FEATURES THAT CONTRIBUTE TO RISK:  Closed-mindedness, Polarized thinking and Thought constriction (tunnel vision)    SUICIDE RISK:   Severe:  Frequent, intense, and enduring suicidal ideation, specific plan, no subjective intent, but some objective markers of  intent (i.e., choice of lethal method), the method is accessible, some limited preparatory behavior, evidence of impaired self-control, severe dysphoria/symptomatology, multiple risk factors present, and few if any protective factors, particularly a lack of social support.  PLAN OF CARE:  Daily contact with patient to assess and evaluate symptoms and progress in treatment and Medication management  Observation Level/Precautions:  15 minute checks  Laboratory:  Reviewed as above; glucose and hemoglobin A1c slightly  elevated; creatinine slightly elevated will recheck fasting BMP on 07/25/2018 for reevaluation and ensure normal renal function  Psychotherapy: Encourage patient to be active in milieu and attend group therapy  Medications: Start Wellbutrin XL 150 mg daily for depression; lisinopril 30 mg daily for hypertension continue clonidine protocol for opiate withdrawal; medications for abdominal pain, diarrhea, and nausea in place; trazodone 50 mg as needed at bedtime for sleep;Nicorette gum as needed to prevent nicotine cravings; Tylenol, Naprosyn and Robaxin as needed for pain; hydroxyzine 25 mg every 6 hours as needed for anxiety. Discussed other treatment options to include gabapentin which he declines. We will continue to monitor blood pressure daily Wellbutrin.  Patient denies any seizure history.  Consultations: None  Discharge Concerns: Patient is interested in restarting substance use rehabilitation after discharge  Estimated LOS: 5-10 days  Other: Discussed with patient enrolling into a methadone or Suboxone clinic for pain management due to high risk of relapse given his chronic pain condition   Physician Treatment Plan for Primary Diagnosis: Suicidal overdose (HCC) Long Term Goal(s): Improvement in symptoms so as ready for discharge  Short Term Goals: Ability to identify changes in lifestyle to reduce recurrence of condition will improve, Ability to verbalize feelings will improve, Ability to disclose and discuss suicidal ideas, Ability to demonstrate self-control will improve, Ability to identify and develop effective coping behaviors will improve, Ability to maintain clinical measurements within normal limits will improve, Compliance with prescribed medications will improve and Ability to identify triggers associated with substance abuse/mental health issues will improve  Physician Treatment Plan for Secondary Diagnosis: Principal Problem:   Suicidal overdose (HCC) Active Problems:   MDD  (major depressive disorder), recurrent episode, severe (HCC)   Heroin abuse (HCC)   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)   Chronic pain  Long Term Goal(s): Improvement in symptoms so as ready for discharge  Short Term Goals: Ability to identify changes in lifestyle to reduce recurrence of condition will improve, Ability to verbalize feelings will improve, Ability to disclose and discuss suicidal ideas, Ability to demonstrate self-control will improve, Ability to identify and develop effective coping behaviors will improve, Ability to maintain clinical measurements within normal limits will improve, Compliance with prescribed medications will improve and Ability to identify triggers associated with substance abuse/mental health issues will improve  I certify that inpatient services furnished can reasonably be expected to improve the patient's condition.   Mariel Craft, MD 07/23/2020, 9:07 AM

## 2020-07-23 NOTE — H&P (Signed)
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  I cannot stop my use of drugs."  History of present illness: Erik Campbell is a 40 y.o. male with a history of polysubstance abuse, major depressive disorder and 3 previous suicide attempts by overdose in the past year he was admitted following an opiate overdose.  Patient endorses also using cocaine.  From initial psychiatric consultation on 07/22/2020: Patient reports worsening depressive symptoms to include isolation, withdrawn, guilty, hopelessness, and overall feeling that he will not be able to quit using substances. Patient was recently released from [facility] in [place], a few months ago however has recently relapsed. Yesterday he snorted about a half a gram of heroin in an attempt to end his life. He usually uses about 1/4-1/2 a gram a day, however took it all at once. Urine drug screen was positive for opiates and cocaine. He reports his heroin was most likely couple of cocaine although prior drug screen do show cocaine use. He denies any current withdrawal symptoms, with the exception of excessive sweating. He has a previous diagnosis of major depression, polysubstance abuse, and 3 previous suicide attempts by overdose within the last year. Patient is alert and oriented, calm and cooperative, very pleasant. Patient continues to endorse depressive symptoms to include suicidal ideations while in the emergency room. He  expresses some difficulty with contracting for safety, as he feels very hopeless. Patient does not appear to be responding to internal or external stimuli. He does not display any delusional thinking, as he is able to answer questions appropriately.Patient continues to express suicidal intent, and is a high risk for suicide completion at this time. Patient will benefit from inpatient hospitalization and medication management.   On intake history for hospital admission today, patient admits to suicide attempt.  He describes that his struggle with addiction is "never ending, and he is tired of it."  He states that his opiate addiction began in 2018 following being prescribed opiates postoperatively.  Review of records documents a right shoulder surgery in 2018 with complaints of chronic pain after that time and a repeat surgery in 2019.  Patient states that he completed a 28-day drug rehabilitation program [facility] 03/24/2020.  He reports that he then lived at [facility] where he was able to maintain sobriety from substances until mid January.  He admits that he began reusing heroin (snorting) around this time.  He relates this to his "mental condition" which kept him from going to meetings.  Patient endorses he had stopped going to NA meetings just prior to his relapse.  Patient endorses symptoms of depression.  He states he has never been treated for depression, and is open to starting medication for depression management.  Patient reports that his mother lives in [place], [place] and is supportive of him.  However, he has not yet told his mother about this hospitalization.  Patient continues to report suicidal ideation, but is able to contract for safety while in the hospital.  He denies any homicidal ideation.  He denies any current or past episodes of auditory or visual hallucinations.  Associated Signs/Symptoms: Depression Symptoms:  depressed  mood, anhedonia, hypersomnia, psychomotor retardation, fatigue, feelings of worthlessness/guilt, difficulty concentrating, hopelessness, recurrent thoughts of death, suicidal thoughts with specific plan, suicidal attempt, anxiety, loss of energy/fatigue, disturbed sleep, Duration of Depression Symptoms: Greater than two weeks  (Hypo) Manic Symptoms:  Denies Anxiety Symptoms:  Excessive Worry, Psychotic Symptoms:  Denies Duration of Psychotic Symptoms: No data recorded PTSD Symptoms: NA    Total Time spent with patient: 1 hour  Past Psychiatric History: Polysubstance abuse, most recent heroin and cocaine; major depressive disorder, recurrent, severe behavior, anxiousness  Is the patient at risk to self? Yes.    Has the patient been a risk to self in the past 6 months? Yes.    Has the patient been a risk to self within the distant past? Yes.    Is the patient a risk to others? No.  Has the patient been a risk to others in the past 6 months? No.  Has the patient been a risk to others within the distant past? No.   Prior Inpatient Therapy:  Yes, patient reports prior detox with [facility] Prior Outpatient Therapy:  Yes, but has fallen out of treatment  Alcohol Screening: 1. How often do you have a drink containing alcohol?: Monthly or less 2. How many drinks containing alcohol do you have on a typical day when you are drinking?: 1 or 2 3. How often do you have six or more drinks on one occasion?: Never AUDIT-C Score: 1 4. How often during the last year have you found that you were not able to stop drinking once you had started?: Never 5. How often during the last year have you failed to do what was normally expected from you because of drinking?: Never 6. How often during the last year have you needed a first drink in the morning to get yourself going after a heavy drinking session?: Never 7. How often during the last year have you had a feeling of guilt of remorse after  drinking?: Never 9. Have you or someone else been injured as a result of your drinking?: No 10. Has a relative or friend or a doctor or another health worker been concerned about your drinking or suggested you cut down?: No Alcohol Use Disorder Identification Test Final Score (AUDIT): 1 Substance Abuse History in the last 12 months:  Yes.    Other substance use: Patient reports smoking 1 pack of cigarettes per day Snorts cocaine and heroin Denies other drug use to include marijuana (last UDS positive for THC was 03/2020) Denies regular alcohol use Drinks significant amounts of caffeine daily.  Consequences of Substance Abuse: Medical Consequences:  Overdose Previous Psychotropic Medications: Yes  Psychological Evaluations: Yes  Past Medical History:  Past Medical History:  Diagnosis Date  . Hypertension    History reviewed. No pertinent surgical history. Family History: History reviewed. No pertinent family history. Family Psychiatric  History: None reported  Tobacco Screening:   Smokes 1 pack/day  Social History:  Social History   Substance and Sexual Activity  Alcohol Use Not Currently   Comment: not in the past 30 days      Social History   Substance and Sexual Activity  Drug Use Not Currently  . Types: "Crack" cocaine, Heroin   Comment: not in the past 30 days     Additional Social History:  Was living at [facility].  He believes that he is now currently homeless.            Allergies:  No Known Allergies Lab Results:  Results for orders placed or performed during the hospital encounter of 07/21/20 (from the past 48 hour(s))  CBG monitoring, ED     Status: Abnormal   Collection Time: 07/21/20  9:49 PM  Result Value Ref Range   Glucose-Capillary 151 (H) 70 - 99 mg/dL    Comment: Glucose reference range applies only to samples taken after fasting for at least 8 hours.  Comprehensive metabolic panel     Status: Abnormal   Collection Time:  07/21/20  9:50 PM  Result Value Ref Range   Sodium 136 135 - 145 mmol/L   Potassium 5.0 3.5 - 5.1 mmol/L   Chloride 98 98 - 111 mmol/L   CO2 27 22 - 32 mmol/L   Glucose, Bld 159 (H) 70 - 99 mg/dL    Comment: Glucose reference range applies only to samples taken after fasting for at least 8 hours.   BUN 17 6 - 20 mg/dL   Creatinine, Ser 4.09 (H) 0.61 - 1.24 mg/dL   Calcium 9.4 8.9 - 81.1 mg/dL   Total Protein 7.2 6.5 - 8.1 g/dL   Albumin 4.1 3.5 - 5.0 g/dL   AST 19 15 - 41 U/L   ALT 22 0 - 44 U/L   Alkaline Phosphatase 67 38 - 126 U/L   Total Bilirubin 0.8 0.3 - 1.2 mg/dL   GFR, Estimated >91 >47 mL/min    Comment: (NOTE) Calculated using the CKD-EPI Creatinine Equation (2021)    Anion gap 11 5 - 15    Comment: Performed at [facility], [address], [place], [place] [address]  Ethanol     Status: None   Collection Time: 07/21/20  9:50 PM  Result Value Ref Range   Alcohol, Ethyl (B) <10 <10 mg/dL    Comment: (NOTE) Lowest detectable limit for serum alcohol is 10 mg/dL.  For medical purposes only. Performed at [facility], [address] [place], [place] [address]   Salicylate level     Status: Abnormal   Collection Time: 07/21/20  9:50 PM  Result Value Ref Range   Salicylate Lvl <7.0 (L) 7.0 - 30.0 mg/dL    Comment: Performed at [facility], [address] [place], [place] [address]  Acetaminophen level     Status: Abnormal   Collection Time: 07/21/20  9:50 PM  Result Value Ref Range   Acetaminophen (Tylenol), Serum <10 (L) 10 - 30 ug/mL    Comment: (NOTE) Therapeutic concentrations vary significantly. A range of 10-30 ug/mL  may be an effective concentration for many patients. However, some  are best treated at concentrations outside of this range. Acetaminophen concentrations >150 ug/mL at 4 hours after ingestion  and >50 ug/mL at 12 hours after ingestion are often associated with  toxic  reactions.  Performed at [facility], [address] [place], [place] [address]   cbc     Status: None   Collection Time: 07/21/20  9:50 PM  Result Value Ref Range   WBC 7.9 4.0 - 10.5 K/uL   RBC 5.08 4.22 - 5.81 MIL/uL   Hemoglobin 15.5 13.0 - 17.0 g/dL   HCT 52.8 41.3 - 24.4 %   MCV 88.6 80.0 - 100.0 fL   MCH 30.5 26.0 - 34.0 pg   MCHC  34.4 30.0 - 36.0 g/dL   RDW 16.111.9 09.611.5 - 04.515.5 %   Platelets 250 150 - 400 K/uL   nRBC 0.0 0.0 - 0.2 %    Comment: Performed at [facility], [address] [place], [place] [address]  Rapid urine drug screen (hospital performed)     Status: Abnormal   Collection Time: 07/22/20 12:53 AM  Result Value Ref Range   Opiates POSITIVE (A) NONE DETECTED   Cocaine POSITIVE (A) NONE DETECTED   Benzodiazepines NONE DETECTED NONE DETECTED   Amphetamines NONE DETECTED NONE DETECTED   Tetrahydrocannabinol NONE DETECTED NONE DETECTED   Barbiturates NONE DETECTED NONE DETECTED    Comment: (NOTE) DRUG SCREEN FOR MEDICAL PURPOSES ONLY.  IF CONFIRMATION IS NEEDED FOR ANY PURPOSE, NOTIFY LAB WITHIN 5 DAYS.  LOWEST DETECTABLE LIMITS FOR URINE DRUG SCREEN Drug Class                     Cutoff (ng/mL) Amphetamine and metabolites    1000 Barbiturate and metabolites    200 Benzodiazepine                 200 Tricyclics and metabolites     300 Opiates and metabolites        300 Cocaine and metabolites        300 THC                            50 Performed at [facility], [address] [place], [place] [address]   Resp Panel by RT-PCR (Flu A&B, Covid) Urine, Clean Catch     Status: None   Collection Time: 07/22/20 12:53 AM   Specimen: Urine, Clean Catch; Nasopharyngeal(NP) swabs in vial transport medium  Result Value Ref Range   SARS Coronavirus 2 by RT PCR NEGATIVE NEGATIVE    Comment: (NOTE) SARS-CoV-2 target nucleic acids are NOT DETECTED.  The SARS-CoV-2 RNA is generally detectable in upper  respiratory specimens during the acute phase of infection. The lowest concentration of SARS-CoV-2 viral copies this assay can detect is 138 copies/mL. A negative result does not preclude SARS-Cov-2 infection and should not be used as the sole basis for treatment or other patient management decisions. A negative result may occur with  improper specimen collection/handling, submission of specimen other than nasopharyngeal swab, presence of viral mutation(s) within the areas targeted by this assay, and inadequate number of viral copies(<138 copies/mL). A negative result must be combined with clinical observations, patient history, and epidemiological information. The expected result is Negative.  Fact Sheet for Patients:  BloggerCourse.comhttps://www.fda.gov/media/152166/download  Fact Sheet for Healthcare Providers:  SeriousBroker.ithttps://www.fda.gov/media/152162/download  This test is no t yet approved or cleared by the [place] FDA and  has been authorized for detection and/or diagnosis of SARS-CoV-2 by FDA under an Emergency Use Authorization (EUA). This EUA will remain  in effect (meaning this test can be used) for the duration of the COVID-19 declaration under Section 564(b)(1) of the Act, 21 U.S.C.section 360bbb-3(b)(1), unless the authorization is terminated  or revoked sooner.       Influenza A by PCR NEGATIVE NEGATIVE   Influenza B by PCR NEGATIVE NEGATIVE    Comment: (NOTE) The Xpert Xpress SARS-CoV-2/FLU/RSV plus assay is intended as an aid in the diagnosis of influenza from Nasopharyngeal swab specimens and should not be used as a sole basis for treatment. Nasal washings and aspirates are unacceptable for Xpert Xpress SARS-CoV-2/FLU/RSV  testing.  Fact Sheet for Patients: BloggerCourse.com  Fact Sheet for Healthcare Providers: SeriousBroker.it  This test is not yet approved or cleared by the [place] FDA and has been authorized for  detection and/or diagnosis of SARS-CoV-2 by FDA under an Emergency Use Authorization (EUA). This EUA will remain in effect (meaning this test can be used) for the duration of the COVID-19 declaration under Section 564(b)(1) of the Act, 21 U.S.C. section 360bbb-3(b)(1), unless the authorization is terminated or revoked.  Performed at [facility], [address], [place], [place] [address]     Blood Alcohol level:  Lab Results  Component Value Date   ETH <10 07/21/2020   ETH <10 07/01/2020    Metabolic Disorder Labs:  Lab Results  Component Value Date   HGBA1C 5.1 03/21/2020   No results found for: PROLACTIN No results found for: CHOL, TRIG, HDL, CHOLHDL, VLDL, LDLCALC  Current Medications: Current Facility-Administered Medications  Medication Dose Route Frequency Provider Last Rate Last Admin  . acetaminophen (TYLENOL) tablet 650 mg  650 mg Oral Q6H PRN Maryagnes Amos, FNP      . alum & mag hydroxide-simeth (MAALOX/MYLANTA) 200-200-20 MG/5ML suspension 30 mL  30 mL Oral Q4H PRN Maryagnes Amos, FNP      . buPROPion (WELLBUTRIN XL) 24 hr tablet 150 mg  150 mg Oral Daily Mariel Craft, MD   150 mg at 07/23/20 1215  . cloNIDine (CATAPRES) tablet 0.1 mg  0.1 mg Oral QID Maryagnes Amos, FNP   0.1 mg at 07/23/20 1215   Followed by  . [START ON 07/24/2020] cloNIDine (CATAPRES) tablet 0.1 mg  0.1 mg Oral BH-qamhs Starkes-Perry, Juel Burrow, FNP       Followed by  . [START ON 07/27/2020] cloNIDine (CATAPRES) tablet 0.1 mg  0.1 mg Oral QAC breakfast Starkes-Perry, Juel Burrow, FNP      . dicyclomine (BENTYL) tablet 20 mg  20 mg Oral Q6H PRN Maryagnes Amos, FNP   20 mg at 07/22/20 2019  . hydrOXYzine (ATARAX/VISTARIL) tablet 25 mg  25 mg Oral Q6H PRN Maryagnes Amos, FNP      . lisinopril (ZESTRIL) tablet 30 mg  30 mg Oral Once Maryagnes Amos, FNP      . loperamide (IMODIUM) capsule 2-4 mg  2-4 mg Oral PRN Starkes-Perry, Juel Burrow,  FNP      . magnesium hydroxide (MILK OF MAGNESIA) suspension 30 mL  30 mL Oral Daily PRN Starkes-Perry, Juel Burrow, FNP      . methocarbamol (ROBAXIN) tablet 500 mg  500 mg Oral Q8H PRN Maryagnes Amos, FNP   500 mg at 07/23/20 0814  . naproxen (NAPROSYN) tablet 500 mg  500 mg Oral BID PRN Maryagnes Amos, FNP   500 mg at 07/23/20 1914  . nicotine polacrilex (NICORETTE) gum 2 mg  2 mg Oral PRN Jaclyn Shaggy, PA-C   2 mg at 07/23/20 1452  . ondansetron (ZOFRAN-ODT) disintegrating tablet 4 mg  4 mg Oral Q6H PRN Maryagnes Amos, FNP      . traZODone (DESYREL) tablet 50 mg  50 mg Oral QHS,MR X 1 Laveda Abbe, NP   50 mg at 07/22/20 2102   PTA Medications: Medications Prior to Admission  Medication Sig Dispense Refill Last Dose  . lisinopril (ZESTRIL) 30 MG tablet Take 1 tablet (30 mg total) by mouth daily. 30 tablet 0     Musculoskeletal: Strength & Muscle Tone: In bed Gait & Station:  In bed Patient leans: N/A  Psychiatric Specialty Exam: Physical Exam  Physical Exam Vitals and nursing note reviewed.  Constitutional:      Appearance: Normal appearance.     Comments: sleepy  HENT:     Head: Normocephalic and atraumatic.  Cardiovascular:     Rate and Rhythm: Normal rate.  Pulmonary:     Effort: Respiratory distress present.     Breath sounds: No wheezing.  Musculoskeletal:        General: Normal range of motion.     Cervical back: Normal range of motion.  Neurological:     General: No focal deficit present.     Mental Status: He is oriented to person, place, and time.     Review of Systems  Constitutional: Positive for chills and diaphoresis.  HENT: Negative.   Respiratory: Negative for cough, chest tightness and shortness of breath.   Cardiovascular: Negative for chest pain.  Gastrointestinal: Positive for abdominal pain (cramping).  Musculoskeletal: Positive for arthralgias, back pain and myalgias.  Neurological: Negative.    Psychiatric/Behavioral: Positive for dysphoric mood, self-injury, sleep disturbance and suicidal ideas. Negative for agitation, behavioral problems, confusion, decreased concentration and hallucinations. The patient is nervous/anxious. The patient is not hyperactive.       Review of Systems  Blood pressure (!) 113/91, pulse (!) 54, temperature 97.9 F (36.6 C), temperature source Oral, resp. rate 18, height 6' (1.829 m), weight 94.8 kg, SpO2 100 %.Body mass index is 28.35 kg/m.  General Appearance: Disheveled  Eye Contact:  Minimal  Speech:  Normal Rate  Volume:  Decreased  Mood:  Depressed and Hopeless  Affect:  Congruent  Thought Process:  Descriptions of Associations: Intact  Orientation:  Full (Time, Place, and Person)  Thought Content:  Logical and Hallucinations: None  Suicidal Thoughts:  No  Homicidal Thoughts:  No  Memory:  Immediate;   Fair Recent;   Fair Remote;   Fair  Judgement:  Poor  Insight:  Shallow  Psychomotor Activity:  Psychomotor Retardation  Concentration:  Concentration: Fair  Recall:  Fair  Fund of Knowledge:  Fair  Language:  Good  Akathisia:  No  Handed:  Right  AIMS (if indicated):     Assets:  Resilience Social Support  ADL's:  Intact  Cognition:  Impaired,  Mild  Sleep:  Number of Hours: 6.25    Treatment Plan Summary: Daily contact with patient to assess and evaluate symptoms and progress in treatment and Medication management  Observation Level/Precautions:  15 minute checks  Laboratory:  Reviewed as above; glucose and hemoglobin A1c slightly elevated; creatinine slightly elevated will recheck fasting BMP on 07/25/2018 for reevaluation and ensure normal renal function  Psychotherapy: Encourage patient to be active in milieu and attend group therapy  Medications: Start Wellbutrin XL 150 mg daily for depression; lisinopril 30 mg daily for hypertension continue clonidine protocol for opiate withdrawal; medications for abdominal pain, diarrhea,  and nausea in place; trazodone 50 mg as needed at bedtime for sleep;Nicorette gum as needed to prevent nicotine cravings; Tylenol, Naprosyn and Robaxin as needed for pain; hydroxyzine 25 mg every 6 hours as needed for anxiety. Discussed other treatment options to include gabapentin which he declines. We will continue to monitor blood pressure daily Wellbutrin.  Patient denies any seizure history.  Consultations: None  Discharge Concerns: Patient is interested in restarting substance use rehabilitation after discharge  Estimated LOS: 5-10 days  Other: Discussed with patient enrolling into a methadone or Suboxone clinic for pain management due  to high risk of relapse given his chronic pain condition   Physician Treatment Plan for Primary Diagnosis: Suicidal overdose (HCC) Long Term Goal(s): Improvement in symptoms so as ready for discharge  Short Term Goals: Ability to identify changes in lifestyle to reduce recurrence of condition will improve, Ability to verbalize feelings will improve, Ability to disclose and discuss suicidal ideas, Ability to demonstrate self-control will improve, Ability to identify and develop effective coping behaviors will improve, Ability to maintain clinical measurements within normal limits will improve, Compliance with prescribed medications will improve and Ability to identify triggers associated with substance abuse/mental health issues will improve  Physician Treatment Plan for Secondary Diagnosis: Principal Problem:   Suicidal overdose (HCC) Active Problems:   MDD (major depressive disorder), recurrent episode, severe (HCC)   Heroin abuse (HCC)   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)   Chronic pain  Long Term Goal(s): Improvement in symptoms so as ready for discharge  Short Term Goals: Ability to identify changes in lifestyle to reduce recurrence of condition will improve, Ability to verbalize feelings will improve, Ability  to disclose and discuss suicidal ideas, Ability to demonstrate self-control will improve, Ability to identify and develop effective coping behaviors will improve, Ability to maintain clinical measurements within normal limits will improve, Compliance with prescribed medications will improve and Ability to identify triggers associated with substance abuse/mental health issues will improve  I certify that inpatient services furnished can reasonably be expected to improve the patient's condition.    Mariel Craft, MD 3/6/20223:46 PM

## 2020-07-24 LAB — BASIC METABOLIC PANEL
Anion gap: 9 (ref 5–15)
BUN: 19 mg/dL (ref 6–20)
CO2: 27 mmol/L (ref 22–32)
Calcium: 9.2 mg/dL (ref 8.9–10.3)
Chloride: 102 mmol/L (ref 98–111)
Creatinine, Ser: 1.12 mg/dL (ref 0.61–1.24)
GFR, Estimated: >60 mL/min (ref >60)
Glucose, Bld: 93 mg/dL (ref 70–99)
Potassium: 4.4 mmol/L (ref 3.5–5.1)
Sodium: 138 mmol/L (ref 135–145)

## 2020-07-24 NOTE — Progress Notes (Signed)
[facility] MD Progress Note  07/24/2020 1:20 PM Erik Campbell  MRN:  [id number] Subjective: Patient is a 40 year old male with opioid disorder, major depressive disorder, cocaine use and chronic pain who was admitted following an intentional overdose of heroin in a suicide attempt.  On interview today the patient reports that he feels much better than he did on admission.  He denies suicidal ideation or passive wish for death.  He denies any thoughts of harming others.  The patient reports that he is experiencing symptoms of diaphoresis, early and middle insomnia, intermittent goosebumps, muscle aches and pains.  He is using as needed medication for opioid withdrawal.  The patient reports chronic ringing in his left ear which is been present for 3 weeks as well as chronic left foot pain.  He denies auditory or visual hallucinations.  He denies paranoid ideation.  He denies access to firearms.  He states that this is his first psychiatric admission.  He would like to go to rehab after discharge.  Patient denies any side effects from his medication or problems with his medication.    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 Hypertension    History reviewed. No pertinent surgical history. Family History: History reviewed. No pertinent family history. Family Psychiatric  History: See H&P Social History:  Social History   Substance and Sexual Activity  Alcohol Use Not Currently   Comment: not in the past 30 days      Social History   Substance and Sexual Activity  Drug Use Not Currently  . Types:  "Crack" cocaine, Heroin   Comment: not in the past 30 days     Social History   Socioeconomic History  . Marital status: Single    Spouse name: Not on file  . Number of children: Not on file  . Years of education: Not on file  . Highest education level: Not on file  Occupational History  . Not on file  Tobacco Use  . Smoking status: Former Games developer  . Smokeless tobacco: Never Used  Vaping Use  . Vaping Use: Never used  Substance and Sexual Activity  . Alcohol use: Not Currently    Comment: not in the past 30 days   . Drug use: Not Currently    Types: "Crack" cocaine, Heroin    Comment: not in the past 30 days   . Sexual activity: Not Currently    Comment: not in the past 30 days   Other Topics Concern  . Not on file  Social History Narrative  . Not on file   Social Determinants of Health   Financial Resource Strain: Not on file  Food Insecurity: Not on file  Transportation Needs: Not on file  Physical Activity: Not on file  Stress: Not on file  Social Connections: Not on file   Additional Social History:                         Sleep: Fair ; early and middle inbsomnia Appetite:  Fair  Current Medications: Current Facility-Administered Medications  Medication Dose Route Frequency Provider  Last Rate Last Admin  . acetaminophen (TYLENOL) tablet 650 mg  650 mg Oral Q6H PRN Maryagnes Amos, FNP      . alum & mag hydroxide-simeth (MAALOX/MYLANTA) 200-200-20 MG/5ML suspension 30 mL  30 mL Oral Q4H PRN Maryagnes Amos, FNP      . buPROPion (WELLBUTRIN XL) 24 hr tablet 150 mg  150 mg Oral Daily Mariel Craft, MD   150 mg at 07/24/20 1008  . cloNIDine (CATAPRES) tablet 0.1 mg  0.1 mg Oral QID Maryagnes Amos, FNP   0.1 mg at 07/24/20 1008   Followed by  . cloNIDine (CATAPRES) tablet 0.1 mg  0.1 mg Oral BH-qamhs Starkes-Perry, Juel Burrow, FNP       Followed by  . [START ON 07/27/2020] cloNIDine (CATAPRES) tablet 0.1 mg  0.1 mg Oral QAC  breakfast Starkes-Perry, Juel Burrow, FNP      . dicyclomine (BENTYL) tablet 20 mg  20 mg Oral Q6H PRN Maryagnes Amos, FNP   20 mg at 07/24/20 1010  . gabapentin (NEURONTIN) capsule 600 mg  600 mg Oral TID Mariel Craft, MD   600 mg at 07/24/20 1008  . hydrOXYzine (ATARAX/VISTARIL) tablet 25 mg  25 mg Oral Q6H PRN Maryagnes Amos, FNP   25 mg at 07/24/20 1014  . lisinopril (ZESTRIL) tablet 30 mg  30 mg Oral Once Maryagnes Amos, FNP      . loperamide (IMODIUM) capsule 2-4 mg  2-4 mg Oral PRN Starkes-Perry, Juel Burrow, FNP      . magnesium hydroxide (MILK OF MAGNESIA) suspension 30 mL  30 mL Oral Daily PRN Starkes-Perry, Juel Burrow, FNP      . methocarbamol (ROBAXIN) tablet 500 mg  500 mg Oral Q8H PRN Maryagnes Amos, FNP   500 mg at 07/24/20 1010  . naproxen (NAPROSYN) tablet 500 mg  500 mg Oral BID PRN Maryagnes Amos, FNP   500 mg at 07/23/20 2376  . nicotine polacrilex (NICORETTE) gum 2 mg  2 mg Oral PRN Jaclyn Shaggy, PA-C   2 mg at 07/24/20 1010  . ondansetron (ZOFRAN-ODT) disintegrating tablet 4 mg  4 mg Oral Q6H PRN Maryagnes Amos, FNP      . traZODone (DESYREL) tablet 50 mg  50 mg Oral QHS,MR X 1 Laveda Abbe, NP   50 mg at 07/23/20 2114    Lab Results:  Results for orders placed or performed during the hospital encounter of 07/22/20 (from the past 48 hour(s))  Basic metabolic panel     Status: None   Collection Time: 07/24/20  6:23 AM  Result Value Ref Range   Sodium 138 135 - 145 mmol/L   Potassium 4.4 3.5 - 5.1 mmol/L   Chloride 102 98 - 111 mmol/L   CO2 27 22 - 32 mmol/L   Glucose, Bld 93 70 - 99 mg/dL    Comment: Glucose reference range applies only to samples taken after fasting for at least 8 hours.   BUN 19 6 - 20 mg/dL   Creatinine, Ser 2.83 0.61 - 1.24 mg/dL   Calcium 9.2 8.9 - 15.1 mg/dL   GFR, Estimated >76 >16 mL/min    Comment: (NOTE) Calculated using the CKD-EPI Creatinine Equation (2021)    Anion gap 9 5 - 15     Comment: Performed at [facility], [address], [place], [place] [address]    Blood Alcohol level:  Lab Results  Component Value Date   ETH <  10 07/21/2020   ETH <10 07/01/2020    Metabolic Disorder Labs: Lab Results  Component Value Date   HGBA1C 5.1 03/21/2020   No results found for: PROLACTIN No results found for: CHOL, TRIG, HDL, CHOLHDL, VLDL, LDLCALC  Physical Findings: AIMS: Facial and Oral Movements Muscles of Facial Expression: None, normal Lips and Perioral Area: None, normal Jaw: None, normal Tongue: None, normal,Extremity Movements Upper (arms, wrists, hands, fingers): None, normal Lower (legs, knees, ankles, toes): None, normal, Trunk Movements Neck, shoulders, hips: None, normal, Overall Severity Severity of abnormal movements (highest score from questions above): None, normal Incapacitation due to abnormal movements: None, normal Patient's awareness of abnormal movements (rate only patient's report): No Awareness, Dental Status Current problems with teeth and/or dentures?: No Does patient usually wear dentures?: No  CIWA:    COWS:  COWS Total Score: 4  Musculoskeletal: Strength & Muscle Tone: within normal limits Gait & Station: normal Patient leans: N/A  Psychiatric Specialty Exam: Physical Exam Vitals and nursing note reviewed.  Constitutional:      Appearance: He is normal weight.  HENT:     Head: Normocephalic and atraumatic.  Pulmonary:     Effort: Pulmonary effort is normal.  Musculoskeletal:        General: Normal range of motion.     Cervical back: Normal range of motion.  Neurological:     General: No focal deficit present.     Mental Status: He is alert and oriented to person, place, and time.  Psychiatric:        Attention and Perception: He does not perceive auditory or visual hallucinations.        Mood and Affect: Mood is anxious and depressed.        Speech: Speech normal.        Behavior: Behavior normal.  Behavior is cooperative.        Thought Content: Thought content is not paranoid or delusional. Thought content does not include homicidal or suicidal ideation. Thought content does not include homicidal or suicidal plan.        Cognition and Memory: Cognition normal.        Judgment: Judgment normal.     Review of Systems  Constitutional: Negative for fever.  HENT: Negative for sneezing and sore throat.   Respiratory: Negative for cough and shortness of breath.   Cardiovascular: Negative for chest pain.  Gastrointestinal: Positive for constipation and nausea. Negative for diarrhea and vomiting.  Genitourinary: Positive for difficulty urinating.  Musculoskeletal: Positive for arthralgias and back pain.  Neurological:       Tinnitis in L ear x 3 weeks  Hematological: Does not bruise/bleed easily.  Psychiatric/Behavioral: Positive for dysphoric mood and sleep disturbance. Negative for agitation, confusion, hallucinations, self-injury and suicidal ideas. The patient is nervous/anxious.     Blood pressure 126/66, pulse (!) 54, temperature 97.8 F (36.6 C), temperature source Oral, resp. rate 18, height 6' (1.829 m), weight 94.8 kg, SpO2 100 %.Body mass index is 28.35 kg/m.  General Appearance: Casual  Eye Contact:  Fair  Speech:  Clear and Coherent and Normal Rate  Volume:  Normal  Mood:  Anxious and Depressed  Affect:  Congruent and Constricted  Thought Process:  Coherent, Goal Directed and Linear  Orientation:  Full (Time, Place, and Person)  Thought Content:  Logical and No hallucinations,paranoia, referential thinking or delusions elicited  Suicidal Thoughts:  No  Homicidal Thoughts:  No  Memory:  Immediate;   Fair Recent;   Fair Remote;  Fair  Judgement:  Fair  Insight:  Limited but improving  Psychomotor Activity:  Normal  Concentration:  Concentration: Fair  Recall:  [employer]  Fund of Knowledge:  Fair  Language:  Good  Akathisia:  No  Handed:  Right  AIMS (if indicated):      Assets:  Communication Skills Desire for Improvement Resilience Social Support  ADL's:  Intact  Cognition:  WNL  Sleep:  Number of Hours: 6.75     Treatment Plan Summary: Daily contact with patient to assess and evaluate symptoms and progress in treatment.  Continue every 15 minute observation level for safety of patient  Continue Wellbutrin XL 24-hour tablet 150 mg daily for depression.  Continue clonidine taper to manage withdrawal symptoms.    Continue gabapentin 600 mg 3 times daily  Continue hydroxyzine 25 mg every 6 hours as needed anxiety  Continue trazodone 50 mg at bedtime as needed insomnia.  May repeat x1.    Continue methocarbamol 500 mg every 8 hours as needed muscle spasms.  Encourage patient to participate in recreational therapy, group therapy, milieu therapy, CBT while on the unit.  Social work is working on aftercare plans to include substance use disorder residential treatment program medication management outpatient follow-up as well as psychotherapy outpatient follow-up.  I certify that inpatient services furnished can reasonably be expected to addition.  Claudie ReveringMartha L James, MD 07/24/2020, 1:20 PM

## 2020-07-24 NOTE — [facility] Group Notes (Signed)
LCSW Group Therapy Note  Type of Therapy/Topic: Group Therapy: Six Dimensions of Wellness  Participation Level: Did not attend  Description of Group:  This group will address the concept of wellness and the six concepts of wellness: occupational, physical, social, intellectual, spiritual, and emotional. Patients will be encouraged to process areas in their lives that are out of balance and identify reasons for remaining unbalanced. Patients will be encouraged to explore ways to practice healthy habits daily to attain better physical and mental health outcomes.  Therapeutic Goals:  1. Identify aspects of wellness that they are doing well.  2. Identify aspects of wellness that they would like to improve upon.  3. Identify one action they can take to improve an aspect of wellness in their lives.  Summary of Patient Progress: Did not attend

## 2020-07-24 NOTE — Progress Notes (Signed)
Patient rated his day as a 4 out of 10 but did not go into further detail. His goal for tomorrow is to attend more of the groups.

## 2020-07-24 NOTE — B[facility] Counselor (Signed)
Adult Comprehensive Assessment  Patient ID: Erik Campbell, male   DOB: [birth year], 40 y.o.   MRN: [id number]  Information Source: Information source: Patient  Current Stressors:  Patient states their primary concerns and needs for treatment are:: "I had an overdose with suicidal thoughts because I want to stop drinking" Patient states their goals for this hospitilization and ongoing recovery are:: "I want to stop drinking" Educational / Learning stressors: Pt reports having a G.E.D. Employment / Job issues: Pt reports being unemployed Family Relationships: Pt reports having a good relationship with his mother and siblings  but does not know his father Surveyor, quantity / Lack of resources (include bankruptcy): Pt reports no income and financial difficulties Housing / Lack of housing: Pt reports living in an [place] house but left 3 days ago and is now homeless Physical health (include injuries & life threatening diseases): Pt reports no stressors Social relationships: Pt reports few social relationships Substance abuse: Pt reports using Cocaine a couple times a week and Heroin daily Bereavement / Loss: Pt reports no stressors  Living/Environment/Situation:  Living Arrangements: Alone Living conditions (as described by patient or guardian): "I got kicked out of the [place] house and now I am homeless" Who else lives in the home?: No one How long has patient lived in current situation?: 3 days What is atmosphere in current home: Dangerous,Temporary  Family History:  Marital status: Single Are you sexually active?: No What is your sexual orientation?: Heterosexual Has your sexual activity been affected by drugs, alcohol, medication, or emotional stress?: Yes Does patient have children?: Yes How many children?: 1 How is patient's relationship with their children?: "I have a 55 year old son and we get along alright"  Childhood History:  By whom was/is the patient raised?: Mother Additional  childhood history information: Pt reports that he does not know his father Description of patient's relationship with caregiver when they were a child: "Good with my mother" Patient's description of current relationship with people who raised him/her: "It is ok now" How were you disciplined when you got in trouble as a child/adolescent?: Spankings and groundings Does patient have siblings?: Yes Number of Siblings: 2 Description of patient's current relationship with siblings: "I have a half brother and a half sister and we get along ok" Did patient suffer any verbal/emotional/physical/sexual abuse as a child?: No Did patient suffer from severe childhood neglect?: No Has patient ever been sexually abused/assaulted/raped as an adolescent or adult?: No Was the patient ever a victim of a crime or a disaster?: No Spoken with a professional about abuse?: No Does patient feel these issues are resolved?: No Witnessed domestic violence?: No Has patient been affected by domestic violence as an adult?: No  Education:  Highest grade of school patient has completed: Pt reports having a G.E.D. Currently a student?: No Learning disability?: No  Employment/Work Situation:   Employment situation: Unemployed Patient's job has been impacted by current illness: Yes Describe how patient's job has been impacted: "I cant work because I cant stop taking the drugs" What is the longest time patient has a held a job?: 8 years Where was the patient employed at that time?: [employer] Has patient ever been in the [employer]?: No  Financial Resources:   Financial resources: No income Does patient have a Lawyer or guardian?: No  Alcohol/Substance Abuse:   What has been your use of drugs/alcohol within the last 12 months?: Pt reports usinig Heroin daily and Cocaine a couple times a week If attempted  suicide, did drugs/alcohol play a role in this?: Yes Alcohol/Substance Abuse Treatment Hx: Past Tx,  Inpatient,Attends AA/NA If yes, describe treatment: Pt reports going to [facility] in [place] in November 2021 and NA from November 2021 to January 2022 Has alcohol/substance abuse ever caused legal problems?: Yes (Pt reports getting a DUI in September 2021 but has not been to court for it and is not sure if it is still active.)  Social Support System:   [employer] Support System: Poor Describe Community Support System: Mother and sponser Type of faith/religion: Ephriam Knuckles How does patient's faith help to cope with current illness?: Prayer  Leisure/Recreation:   Do You Have Hobbies?: Yes Leisure and Hobbies: "I'm not sure"  Strengths/Needs:   What is the patient's perception of their strengths?: "Good at working with my hands" Patient states they can use these personal strengths during their treatment to contribute to their recovery: Pt did not specify Patient states these barriers may affect/interfere with their treatment: None Patient states these barriers may affect their return to the community: None Other important information patient would like considered in planning for their treatment: None  Discharge Plan:   Currently receiving community mental health services: No Patient states concerns and preferences for aftercare planning are: Pt reports wanting to go to a residential treatment center Patient states they will know when they are safe and ready for discharge when: "When I can get into a treatment center" Does patient have access to transportation?: Yes (Pt reports using the bus) Does patient have financial barriers related to discharge medications?: Yes Patient description of barriers related to discharge medications: Pt has no income or medical insurance Plan for living situation after discharge: Pt would like to go to a residential substance use treatment center Will patient be returning to same living situation after discharge?:  No  Summary/Recommendations:   Summary and Recommendations (to be completed by the evaluator): Erik Campbell is a 40 year old, Caucasian, male who was admitted to the hospital due to worsening depression, SI, and an overdoes on half a gram of Heroin.  The Pt reports that he was living in an [place] house but was kicked out approximately 3 days ago and is now homeless.  The Pt reports that he has a good relationship with his family and denies any trauma in his childhood.  The Pt reports that he is unemployed and uses the bus for transportation.  The Pt reports using Heroin daily and Cocaine a couple times a week.  The Pt reports that his last use of both substance was on 07/21/2020.  The Pt reports that he was at [facility] in November 2021 and in [place] from November 2021 to January 2022.  The Pt reports that he is interested in going back to residential treatment.  While in the hospital the Pt can benefit from crisis stabilization, medication evaluation, group therapy, psycho-education, case management, and discharge planning.  Upon discharge the Pt would like to go to residential treamtent but will also be provided with a homelessness resources for shelters.  The Pt will also follow up with local outpatient services for therapy and medication management.  Aram Beecham. 07/24/2020

## 2020-07-24 NOTE — [facility] Group Notes (Signed)
Occupational Therapy Group Note Date: 07/24/2020 Group Topic/Focus: Stress Management  Group Description: Group encouraged increased participation and engagement through discussion focused on topic of stress management. Patients engaged interactively to discuss components of stress including physical signs, emotional signs, negative management strategies, and positive management strategies. Each individual identified one new stress management strategy they would like to try moving forward.    Therapeutic Goals: Identify current stressors Identify healthy vs unhealthy stress management strategies/techniques Discuss and identify physical and emotional signs of stress Participation Level: Patient did not attend OT group session despite personal invitation.    Plan: Continue to engage patient in OT groups 2 - 3x/week.  07/24/2020  Laterria Lasota, MOT, OTR/L  

## 2020-07-24 NOTE — BHH Counselor (Signed)
CSW provided patient with a packet of resources for homelessness.  This packet contained a list of shelter, GoodRX cards, suicide prevention hotline numbers, food resources, and insurance resources.  CSW will follow up with patient to provide assistance with discharge planning.  

## 2020-07-24 NOTE — Tx Team (Signed)
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 acetaminophen (TYLENOL) tablet 650 mg  650 mg Oral Q6H PRN Suella Broad, FNP      . alum & mag hydroxide-simeth (MAALOX/MYLANTA) 200-200-20 MG/5ML suspension 30 mL  30 mL Oral Q4H PRN Suella Broad, FNP      . buPROPion (WELLBUTRIN XL) 24 hr tablet 150 mg  150 mg Oral Daily Lavella Hammock, MD   150 mg at 07/24/20 1008  . cloNIDine (CATAPRES) tablet 0.1 mg  0.1 mg Oral QID Suella Broad, FNP   0.1 mg at 07/24/20 1008   Followed by  . cloNIDine (CATAPRES) tablet 0.1 mg  0.1 mg Oral BH-qamhs Starkes-Perry, Gayland Curry, FNP       Followed by  . [START ON 07/27/2020] cloNIDine (CATAPRES) tablet 0.1 mg  0.1 mg Oral QAC breakfast Starkes-Perry, Gayland Curry, FNP      . dicyclomine (BENTYL) tablet 20 mg  20 mg Oral Q6H PRN Suella Broad, FNP   20 mg at 07/24/20 1010  . gabapentin (NEURONTIN) capsule 600 mg  600 mg Oral TID Lavella Hammock, MD   600 mg at 07/24/20 1008  . hydrOXYzine (ATARAX/VISTARIL) tablet 25 mg  25 mg Oral Q6H PRN Suella Broad, FNP   25 mg at 07/23/20 2114  . lisinopril (ZESTRIL) tablet 30 mg  30 mg Oral Once Suella Broad, FNP      . loperamide (IMODIUM) capsule 2-4 mg  2-4 mg Oral PRN Starkes-Perry, Gayland Curry, FNP      . magnesium hydroxide (MILK OF MAGNESIA) suspension 30 mL  30 mL Oral Daily PRN Starkes-Perry, Gayland Curry, FNP      . methocarbamol (ROBAXIN) tablet 500 mg   500 mg Oral Q8H PRN Suella Broad, FNP   500 mg at 07/24/20 1010  . naproxen (NAPROSYN) tablet 500 mg  500 mg Oral BID PRN Suella Broad, FNP   500 mg at 07/23/20 9292  . nicotine polacrilex (NICORETTE) gum 2 mg  2 mg Oral PRN Prescilla Sours, PA-C   2 mg at 07/24/20 1010  . ondansetron (ZOFRAN-ODT) disintegrating tablet 4 mg  4 mg Oral Q6H PRN Suella Broad, FNP      . traZODone (DESYREL) tablet 50 mg  50 mg Oral QHS,MR X 1 Ethelene Hal, NP   50 mg at 07/23/20 2114   PTA Medications: Medications Prior to Admission  Medication Sig Dispense Refill Last Dose  . lisinopril (ZESTRIL) 30 MG tablet Take 1 tablet (30 mg total) by mouth daily. 30 tablet 0     Patient Stressors:    Patient Strengths:    Treatment Modalities: Medication Management, Group therapy, Case management,  1 to 1 session with clinician, Psychoeducation, Recreational therapy.   Physician Treatment Plan for Primary Diagnosis: Suicidal overdose ([place]) Long Term Goal(s): Improvement in symptoms so as ready for discharge Improvement in symptoms so as ready for discharge   Short Term Goals: Ability to identify changes in lifestyle to reduce recurrence of condition will  improve Ability to verbalize feelings will improve Ability to disclose and discuss suicidal ideas Ability to demonstrate self-control will improve Ability to identify and develop effective coping behaviors will improve Ability to maintain clinical measurements within normal limits will improve Compliance with prescribed medications will improve Ability to identify triggers associated with substance abuse/mental health issues will improve Ability to identify changes in lifestyle to reduce recurrence of condition will improve Ability to verbalize feelings will improve Ability to disclose and discuss suicidal ideas Ability to demonstrate self-control will improve Ability to identify and develop effective coping behaviors will  improve Ability to maintain clinical measurements within normal limits will improve Compliance with prescribed medications will improve Ability to identify triggers associated with substance abuse/mental health issues will improve  Medication Management: Evaluate patient's response, side effects, and tolerance of medication regimen.  Therapeutic Interventions: 1 to 1 sessions, Unit Group sessions and Medication administration.  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[place])   Chronic pain  Long Term Goal(s): Improvement in symptoms so as ready for discharge Improvement in symptoms so as ready for discharge   Short Term Goals: Ability to identify changes in lifestyle to reduce recurrence of condition will improve Ability to verbalize feelings will improve Ability to disclose and discuss suicidal ideas Ability to demonstrate self-control will improve Ability to identify and develop effective coping behaviors will improve Ability to maintain clinical measurements within normal limits will improve Compliance with prescribed medications will improve Ability to identify triggers associated with substance abuse/mental health issues will improve Ability to identify changes in lifestyle to reduce recurrence of condition will improve Ability to verbalize feelings will improve Ability to disclose and discuss suicidal ideas Ability to demonstrate self-control will improve Ability to identify and develop effective coping behaviors will improve Ability to maintain clinical measurements within normal limits will improve Compliance with prescribed medications will improve Ability to identify triggers associated with substance  abuse/mental health issues will improve     Medication Management: Evaluate patient's response, side effects, and tolerance of medication regimen.  Therapeutic Interventions: 1 to 1 sessions, Unit Group sessions and Medication administration.  Evaluation of Outcomes: Not Met   RN Treatment Plan for Primary Diagnosis: Suicidal overdose ([place]) Long Term Goal(s): Knowledge of disease and therapeutic regimen to maintain health will improve  Short Term Goals: Ability to remain free from injury will improve, Ability to verbalize frustration and anger appropriately will improve, Ability to verbalize feelings will improve, Ability to identify and develop effective coping behaviors will improve and Compliance with prescribed medications will improve  Medication Management: RN will administer medications as ordered by provider, will assess and evaluate patient's response and provide education to patient for prescribed medication. RN will report any adverse and/or side effects to prescribing provider.  Therapeutic Interventions: 1 on 1 counseling sessions, Psychoeducation, Medication administration, Evaluate responses to treatment, Monitor vital signs and CBGs as ordered, Perform/monitor CIWA, COWS, AIMS and Fall Risk screenings as ordered, Perform wound care treatments as ordered.  Evaluation of Outcomes: Not Met   LCSW Treatment Plan for Primary Diagnosis: Suicidal overdose ([place]) Long Term Goal(s): Safe transition to appropriate next level of care at discharge, Engage patient in therapeutic group addressing interpersonal concerns.  Short Term Goals: Engage patient in aftercare planning with  referrals and resources, Increase social support, Facilitate patient progression through stages of change regarding substance use diagnoses and concerns, Identify triggers associated with mental health/substance abuse issues and Increase skills for wellness and recovery  Therapeutic Interventions: Assess for all  discharge needs, 1 to 1 time with Social worker, Explore available resources and support systems, Assess for adequacy in community support network, Educate family and significant other(s) on suicide prevention, Complete Psychosocial Assessment, Interpersonal group therapy.  Evaluation of Outcomes: Not Met   Progress in Treatment: Attending groups: No. Participating in groups: No. Taking medication as prescribed: Yes. Toleration medication: Yes. Family/Significant other contact made: No, will contact:  if consent is provided  Patient understands diagnosis: Yes. Discussing patient identified problems/goals with staff: Yes. Medical problems stabilized or resolved: Yes. Denies suicidal/homicidal ideation: Yes. Issues/concerns per patient self-inventory: No.   New problem(s) identified: No, Describe:  none  New Short Term/Long Term Goal(s): detox, medication management for mood stabilization; elimination of SI thoughts; development of comprehensive mental wellness/sobriety plan  Patient Goals:  Pt did not attend   Discharge Plan or Barriers: Patient recently admitted. CSW will continue to follow and assess for appropriate referrals and possible discharge planning.    Reason for Continuation of Hospitalization: Depression Medication stabilization Withdrawal symptoms  Estimated Length of Stay: 3-5 days  Attendees: Patient: Did not attend 07/24/2020   Physician:  07/24/2020   Nursing:  07/24/2020   RN Care Manager: 07/24/2020   Social Worker: Darletta Moll, [place] 07/24/2020   Recreational Therapist:  07/24/2020  Other:  07/24/2020   Other:  07/24/2020   Other: 07/24/2020      Scribe for Treatment Team: Vassie Moselle, LCSW 07/24/2020 10:13 AM

## 2020-07-24 NOTE — Progress Notes (Signed)
Pt spent the morning in bed and did not come  out of his room until lunch time.  Pt then interacted with other pt's on the unit.  Pt denied SI/HI/AVH.  PRN medications given for muscle spasms and cramping.  No nausea reported. Pt appears to be in no acute distress at this time.  Pt  Is safe on the unit with q 15 min checks in place.

## 2020-07-24 NOTE — Progress Notes (Signed)
Recreation Therapy Notes  Date: 3.7.22 Time: 0930 Location: 300 Hall Dayroom  Group Topic: Stress Management  Goal Area(s) Addresses:  Patient will identify positive stress management techniques. Patient will identify benefits of using stress management post d/c.  Intervention: Stress Management  Activity: Guided Imagery.  LRT read a script that took patients on a relaxing journey on the beach to listen to the peaceful waves and take in the all calming elements.  Patients were to listen as the script was read to engage in the activity.    Education:  Stress Management, Discharge Planning.   Education Outcome: Acknowledges Education  Clinical Observations/Feedback: Pt did not attend group session.    Caroll Rancher, LRT/CTRS         Lillia Abed, Marjette A 07/24/2020 11:15 AM

## 2020-07-25 NOTE — Progress Notes (Signed)
Pt denied SI/HI/AVH.  Pt reported that he was not having any withdrawal symptoms today. Pt took medications without incident and no adverse reactions were noted.  Pt asked about over the counter medication that he uses at home to help with  "rining in my ears."  Pt said he would ask the doctor about family bringing in this medication for  pt to use while at [facility].  No other concerns were identified at this time.

## 2020-07-25 NOTE — Progress Notes (Signed)
[facility] MD Progress Note  07/25/2020 2:29 PM Erik Campbell  MRN:  [id number] Subjective: Patient seen and interviewed.  Chart reviewed.  Case discussed in detail with members of the treatment team.  The patient is a 40 year old male with opioid use disorder, cocaine use disorder, major depressive disorder and chronic pain who was admitted following an intentional overdose of heroin in a suicide attempt.  On interview today the patient states that his mood is getting better.  He continues to report depression which he rates at a 5 out of 10.  He denies suicidal ideation intent preparation or plan.  He denies thoughts of harming anyone else.  The patient denies paranoid ideation, auditory hallucinations, visual hallucinations.  He reports that his withdrawal symptoms from heroin are diminishing.  He is sleeping okay with some middle insomnia but able to return to sleep.  He reports his appetite is okay.  The patient's main complaint is chronic ringing of his left ear.  He requests that he be allowed to take over-the-counter Lipo-Flavonoid to alleviate it.  The patient would like to go to rehab after discharge.   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 Hypertension    History reviewed. No pertinent surgical history. Family History: History reviewed. No pertinent family history. Family Psychiatric  History: See H&P Social History:  Social History   Substance and Sexual Activity  Alcohol Use Not Currently   Comment: not in the past 30 days      Social  History   Substance and Sexual Activity  Drug Use Not Currently  . Types: "Crack" cocaine, Heroin   Comment: not in the past 30 days     Social History   Socioeconomic History  . Marital status: Single    Spouse name: Not on file  . Number of children: Not on file  . Years of education: Not on file  . Highest education level: Not on file  Occupational History  . Not on file  Tobacco Use  . Smoking status: Former Games developer  . Smokeless tobacco: Never Used  Vaping Use  . Vaping Use: Never used  Substance and Sexual Activity  . Alcohol use: Not Currently    Comment: not in the past 30 days   . Drug use: Not Currently    Types: "Crack" cocaine, Heroin    Comment: not in the past 30 days   . Sexual activity: Not Currently    Comment: not in the past 30 days   Other Topics Concern  . Not on file  Social History Narrative  . Not on file   Social Determinants of Health   Financial Resource Strain: Not on file  Food Insecurity: Not on file  Transportation Needs: Not on file  Physical Activity: Not on file  Stress: Not on file  Social Connections: Not on file   Additional Social History:                         Sleep: Fair  Appetite:  Good  Current Medications: Current Facility-Administered Medications  Medication Dose Route Frequency Provider Last Rate Last Admin  . acetaminophen (TYLENOL) tablet 650 mg  650 mg Oral Q6H PRN Maryagnes Amos, FNP      . alum & mag hydroxide-simeth (MAALOX/MYLANTA) 200-200-20 MG/5ML suspension 30 mL  30 mL Oral Q4H PRN Maryagnes Amos, FNP      . buPROPion (WELLBUTRIN XL) 24 hr tablet 150 mg  150 mg Oral Daily Mariel Craft, MD   150 mg at 07/25/20 1217  . cloNIDine (CATAPRES) tablet 0.1 mg  0.1 mg Oral BH-qamhs Starkes-Perry, Juel Burrow, FNP   0.1 mg at 07/25/20 1217   Followed by  . [START ON 07/27/2020] cloNIDine (CATAPRES) tablet 0.1 mg  0.1 mg Oral QAC breakfast Starkes-Perry, Juel Burrow, FNP      . dicyclomine  (BENTYL) tablet 20 mg  20 mg Oral Q6H PRN Maryagnes Amos, FNP   20 mg at 07/24/20 1010  . gabapentin (NEURONTIN) capsule 600 mg  600 mg Oral TID Mariel Craft, MD   600 mg at 07/25/20 1216  . hydrOXYzine (ATARAX/VISTARIL) tablet 25 mg  25 mg Oral Q6H PRN Maryagnes Amos, FNP   25 mg at 07/24/20 1014  . lisinopril (ZESTRIL) tablet 30 mg  30 mg Oral Once Maryagnes Amos, FNP      . loperamide (IMODIUM) capsule 2-4 mg  2-4 mg Oral PRN Starkes-Perry, Juel Burrow, FNP      . magnesium hydroxide (MILK OF MAGNESIA) suspension 30 mL  30 mL Oral Daily PRN Starkes-Perry, Juel Burrow, FNP      . methocarbamol (ROBAXIN) tablet 500 mg  500 mg Oral Q8H PRN Maryagnes Amos, FNP   500 mg at 07/24/20 2047  . naproxen (NAPROSYN) tablet 500 mg  500 mg Oral BID PRN Maryagnes Amos, FNP   500 mg at 07/23/20 6269  . nicotine polacrilex (NICORETTE) gum 2 mg  2 mg Oral PRN Jaclyn Shaggy, PA-C   2 mg at 07/25/20 1220  . ondansetron (ZOFRAN-ODT) disintegrating tablet 4 mg  4 mg Oral Q6H PRN Maryagnes Amos, FNP      . traZODone (DESYREL) tablet 50 mg  50 mg Oral QHS,MR X 1 Laveda Abbe, NP   50 mg at 07/24/20 2048    Lab Results:  Results for orders placed or performed during the hospital encounter of 07/22/20 (from the past 48 hour(s))  Basic metabolic panel     Status: None   Collection Time: 07/24/20  6:23 AM  Result Value Ref Range   Sodium 138 135 - 145 mmol/L   Potassium 4.4 3.5 - 5.1 mmol/L   Chloride 102 98 - 111 mmol/L   CO2 27 22 - 32 mmol/L   Glucose, Bld 93 70 - 99 mg/dL    Comment: Glucose reference range applies only to samples taken after fasting for at least 8 hours.   BUN 19 6 - 20 mg/dL   Creatinine, Ser 4.85 0.61 - 1.24 mg/dL   Calcium 9.2 8.9 - 46.2 mg/dL   GFR, Estimated >70 >35 mL/min    Comment: (NOTE) Calculated using the CKD-EPI Creatinine Equation (2021)    Anion gap 9 5 - 15    Comment: Performed at [facility], [address] [place], [place] [address]    Blood Alcohol level:  Lab Results  Component Value Date   [facility] <10 07/21/2020   ETH <10 07/01/2020    Metabolic Disorder Labs: Lab Results  Component Value Date  HGBA1C 5.1 03/21/2020   No results found for: PROLACTIN No results found for: CHOL, TRIG, HDL, CHOLHDL, VLDL, LDLCALC  Physical Findings: AIMS: Facial and Oral Movements Muscles of Facial Expression: None, normal Lips and Perioral Area: None, normal Jaw: None, normal Tongue: None, normal,Extremity Movements Upper (arms, wrists, hands, fingers): None, normal Lower (legs, knees, ankles, toes): None, normal, Trunk Movements Neck, shoulders, hips: None, normal, Overall Severity Severity of abnormal movements (highest score from questions above): None, normal Incapacitation due to abnormal movements: None, normal Patient's awareness of abnormal movements (rate only patient's report): No Awareness, Dental Status Current problems with teeth and/or dentures?: No Does patient usually wear dentures?: No  CIWA:    COWS:  COWS Total Score: 2  Musculoskeletal: Strength & Muscle Tone: within normal limits Gait & Station: normal Patient leans: N/A  Psychiatric Specialty Exam: Physical Exam Vitals and nursing note reviewed.  Constitutional:      Appearance: Normal appearance. He is normal weight.  HENT:     Head: Normocephalic and atraumatic.  Pulmonary:     Effort: Pulmonary effort is normal.  Musculoskeletal:        General: Normal range of motion.     Cervical back: Normal range of motion.  Neurological:     General: No focal deficit present.     Mental Status: He is alert and oriented to person, place, and time.  Psychiatric:        Attention and Perception: He does not perceive auditory or visual hallucinations.        Mood and Affect: Mood is depressed.        Speech: Speech normal.        Behavior: Behavior is cooperative.        Thought Content: Thought content is  not paranoid or delusional. Thought content does not include homicidal or suicidal ideation.        Cognition and Memory: Cognition normal.        Judgment: Judgment is impulsive.     Review of Systems  Constitutional: Positive for chills.       Positive for chills and goosebumps intermittently from opioid withdrawal  HENT: Positive for tinnitus. Negative for sneezing and sore throat.        Positive for chronic ringing in the left ear  Respiratory: Negative for cough and shortness of breath.   Cardiovascular: Negative for chest pain.  Gastrointestinal: Positive for constipation. Negative for diarrhea, nausea and vomiting.  Genitourinary: Negative for difficulty urinating.  Musculoskeletal: Positive for arthralgias.       Positive for joint and muscle aches in association with opioid withdrawal  Neurological: Negative for tremors and headaches.  Psychiatric/Behavioral: Negative for hallucinations, self-injury and suicidal ideas.    Blood pressure 93/82, pulse (!) 58, temperature 97.9 F (36.6 C), temperature source Oral, resp. rate 18, height 6' (1.829 m), weight 94.8 kg, SpO2 99 %.Body mass index is 28.35 kg/m.  General Appearance: Casual  Eye Contact:  Fair  Speech:  Clear and Coherent and Normal Rate  Volume:  Normal  Mood: Anxious and depressed but "getting better"  Affect:  Congruent and Constricted  Thought Process:  Coherent and Goal Directed  Orientation:  Full (Time, Place, and Person)  Thought Content:  Logical and No hallucinations,paranoia, referential thinking or delusions elicited  Suicidal Thoughts:  No  Homicidal Thoughts:  No  Memory:   Immediate;   Fair Recent;   Fair Remote;   Fair  Judgement:  Fair  Insight:  Fair  Psychomotor Activity:  Normal  Concentration:  Concentration: Fair  Recall:  [employer] of Knowledge:  Fair  Language:  Good  Akathisia:  No  Handed:  Right  AIMS (if indicated):     Assets:  Communication Skills Desire for  Improvement Resilience Social Support  ADL's:  Intact  Cognition:  WNL  Sleep:  Number of Hours: 6.25     Treatment Plan Summary: Daily contact with patient to assess and evaluate symptoms and progress in treatment and Medication management  Continue every 15-minute observation level for safety of patient.    Depression: -Continue Wellbutrin XL 24-hour preparation 150 mg daily for depression  Substance use disorder -Continue gabapentin 600 mg 3 times a day for anxiety and withdrawal from substances. -Continue on COWS protocol for opioid withdrawalContinue clonidine taper to manage withdrawal symptoms.   -Continue methocarbamol 500 mg every 8 hours as needed muscle spasms.  Insomnia -Continue trazodone 50 mg at bedtime as needed for insomnia.  Anxiety -Continue hydroxyzine 25 mg every 6 hours as needed for anxiety  Encourage patient to participate in group therapy, CBT, recreational therapy and therapeutic milieu while on the unit.  Social work continues to work on aftercare plans including substance use disorder treatment program, medication management outpatient follow-up and psychotherapy outpatient follow-up.  Patient currently does not have housing.  I certify that inpatient services furnished can reasonably be expected to improve the patient's condition.  Claudie Revering, MD 07/25/2020, 2:29 PM

## 2020-07-25 NOTE — Progress Notes (Signed)
D: Patient presents with sad affect but reports feeling better than previous days. Patient denies SI/HI at this time. Patient also denies AH/VH at this time. Patient contracts for safety.  A: Provided positive reinforcement and encouragement.  R: Patient cooperative and receptive to efforts. Patient remains safe on the unit.   07/24/20 2048  Psych Admission Type (Psych Patients Only)  Admission Status Voluntary  Psychosocial Assessment  Patient Complaints Anxiety  Eye Contact Brief  Facial Expression Pensive;Pained  Affect Appropriate to circumstance  Speech Logical/coherent  Interaction Assertive  Motor Activity Fidgety  Appearance/Hygiene Unremarkable  Behavior Characteristics Cooperative;Appropriate to situation  Mood Depressed;Sad  Thought Process  Coherency WDL  Content WDL  Delusions None reported or observed  Perception WDL  Hallucination None reported or observed  Judgment Impaired  Confusion None  Danger to Self  Current suicidal ideation? Denies  Danger to Others  Danger to Others None reported or observed

## 2020-07-25 NOTE — Progress Notes (Signed)
The patient learned today that he has more family support than he expected. His goal for tomorrow is to focus on his health and to eat more.

## 2020-07-25 NOTE — [facility] Suicide Risk Assessment (Signed)
[facility] INPATIENT:  Family/Significant Other Suicide Prevention Education  Suicide Prevention Education:  Education Completed; Erik Campbell [phone] (Mother) has been identified by the patient as the family member/significant other with whom the patient will be residing, and identified as the person(s) who will aid the patient in the event of a mental health crisis (suicidal ideations/suicide attempt).  With written consent from the patient, the family member/significant other has been provided the following suicide prevention education, prior to the and/or following the discharge of the patient.  The suicide prevention education provided includes the following:  Suicide risk factors  Suicide prevention and interventions  National Suicide Hotline telephone number  [facility] assessment telephone number  [facility] Emergency Assistance 911  [facility] and/or Residential Mobile Crisis Unit telephone number  Request made of family/significant other to:  Remove weapons (e.g., guns, rifles, knives), all items previously/currently identified as safety concern.    Remove drugs/medications (over-the-counter, prescriptions, illicit drugs), all items previously/currently identified as a safety concern.  The family member/significant other verbalizes understanding of the suicide prevention education information provided.  The family member/significant other agrees to remove the items of safety concern listed above.  Erik Campbell states that her son has been struggling with a substance use issue for at least 2 years.  Erik Campbell states that her son had surgery on his shoulder and was prescribed pain medications.  Erik Campbell states that her son then began using the pills to get high and eventually started taking Heroin in a pill form.  Erik Campbell states that her son was recently kicked out of the [place] house he was living in because his roommates found him in the  backyard and he had overdosed on Heroin.  Erik Campbell states that her son has been to at least 6 rehabilitation and detox centers.  Erik Campbell states that she is not sure where her son can go to live but does state that he is not allowed to live with her anymore.  CSW completed SPE with Erik Campbell.   Erik Campbell 07/25/2020, 1:48 PM

## 2020-07-25 NOTE — Progress Notes (Addendum)
°   07/25/20 2051  Psych Admission Type (Psych Patients Only)  Admission Status Voluntary  Psychosocial Assessment  Patient Complaints Depression  Eye Contact Brief  Facial Expression Pensive;Anxious  Affect Appropriate to circumstance  Speech Logical/coherent  Interaction Assertive  Motor Activity Other (Comment) (wnl)  Appearance/Hygiene Unremarkable  Behavior Characteristics Cooperative;Appropriate to situation  Mood Depressed  Thought Process  Coherency WDL  Content WDL  Delusions None reported or observed  Perception WDL  Hallucination None reported or observed  Judgment Impaired  Confusion None  Danger to Self  Current suicidal ideation? Denies  Danger to Others  Danger to Others None reported or observed   Pt seen in hallway. Pt denies SI, HI, AVH and pain. Pt rates depression 5/10 and denies anxiety. Pt only withdrawal symptoms is muscle spasms in legs. Pt given Robaxin with nighttime meds.

## 2020-07-26 ENCOUNTER — Encounter (HOSPITAL_COMMUNITY): Payer: Self-pay | Admitting: Family

## 2020-07-26 MED ORDER — LIPOFLAVONOID PO TABS: 2.0000 | ORAL_TABLET | Freq: Three times a day (TID) | ORAL | Status: DC

## 2020-07-26 MED ORDER — LIPOFLAVONOID PO TABS
2.0000 | ORAL_TABLET | Freq: Three times a day (TID) | ORAL | Status: DC
  Administered 2020-07-26 – 2020-07-27 (×2): 2 via ORAL

## 2020-07-26 MED ORDER — NON FORMULARY: 2.0000 | Freq: Three times a day (TID) | Status: DC

## 2020-07-26 MED ORDER — GABAPENTIN 300 MG PO CAPS
300.0000 mg | ORAL_CAPSULE | Freq: Three times a day (TID) | ORAL | Status: DC
  Administered 2020-07-26: 300 mg via ORAL
  Filled 2020-07-26 (×4): qty 1

## 2020-07-26 NOTE — Plan of Care (Signed)
  Problem: Activity: Goal: Interest or engagement in leisure activities will improve Outcome: Progressing   

## 2020-07-26 NOTE — [facility] Counselor (Signed)
Erik Campbell asked the CSW to refer him to [facility] for outpatient services once he completes his residential treatment.  CSW contacted [facility] and was informed that they are not doing outpatient services at this time.  CSW will find another service for Erik Campbell for outpatient services.  CSW also spoke with [facility] and Erik Campbell who states that Erik Campbell has been accepted to their program and needs to be at the facility by 9am on Friday 07/28/2020.  CSW has informed Erik Campbell of this acceptance to the [facility] program and he states that he is looking forward to going back to [facility].

## 2020-07-26 NOTE — Progress Notes (Signed)
Pt family brought in Lipo Flavonoid Plus and NP-Jason put order in  [facility].

## 2020-07-26 NOTE — Progress Notes (Signed)
   07/26/20 1600  Psych Admission Type (Psych Patients Only)  Admission Status Voluntary  Psychosocial Assessment  Patient Complaints Depression  Eye Contact Fair  Facial Expression Pensive;Anxious  Affect Appropriate to circumstance  Speech Logical/coherent  Interaction Assertive  Motor Activity Other (Comment) (wdl)  Appearance/Hygiene Unremarkable  Behavior Characteristics Cooperative;Appropriate to situation  Mood Depressed  Thought Process  Coherency WDL  Content WDL  Delusions None reported or observed  Perception WDL  Hallucination None reported or observed  Judgment Impaired  Confusion None  Danger to Self  Current suicidal ideation? Denies  Danger to Others  Danger to Others None reported or observed  Erik Campbell has been in his bed much of the day.  He has been complaining of ringing in his ears.  Per MD, his parents are bringing an OTC supplement to help with the ringing in his ears, he is unsure if they can bring them today or tomorrow.  Talked with his about using fan in his room to help with back ground noise to drown out the ringing as well as being in the day room.  Encouraged him to fill out his daily inventory without success.

## 2020-07-26 NOTE — Progress Notes (Signed)
D: Patient presents with pleasant affect at time of assessment. Patient is concerned about his home medication lipoflavanoid being put on his discharge summary so he is able to take it with him to [facility]. Patient reports he is ready to be discharged and go to the treatment facility. Patient denies SI/HI at this time. Patient also denies AH/VH at this time. Patient contracts for safety.  A: Provided positive reinforcement and encouragement.  R: Patient cooperative and receptive to efforts. Patient remains safe on the unit.   07/26/20 2207  Psych Admission Type (Psych Patients Only)  Admission Status Voluntary  Psychosocial Assessment  Patient Complaints Depression  Eye Contact Fair  Facial Expression Pensive  Affect Appropriate to circumstance  Speech Logical/coherent  Interaction Assertive  Motor Activity Other (Comment) (WDL)  Appearance/Hygiene Unremarkable  Behavior Characteristics Cooperative;Appropriate to situation  Mood Depressed;Pleasant  Thought Process  Coherency WDL  Content WDL  Delusions None reported or observed  Perception WDL  Hallucination None reported or observed  Judgment Impaired  Confusion None  Danger to Self  Current suicidal ideation? Denies  Danger to Others  Danger to Others None reported or observed

## 2020-07-26 NOTE — Progress Notes (Signed)
Recreation Therapy Notes  Date: 3.9.22 Time: 0930 Location: 300 Hall Dayroom  Group Topic: Stress Management  Goal Area(s) Addresses:  Patient will identify positive stress management techniques. Patient will identify benefits of using stress management post d/c.  Intervention: Stress Management  Activity:  Meditation.  LRT played a meditation that focused on taking on the characteristics of a mountain by standing strong in the face of adversity.    Education:  Stress Management, Discharge Planning.   Education Outcome: Acknowledges Education  Clinical Observations/Feedback: Pt did not attend group session.     Caroll Rancher, LRT/CTRS         Caroll Rancher A 07/26/2020 11:16 AM

## 2020-07-26 NOTE — [facility] Group Notes (Signed)
[facility] LCSW Group Therapy  07/26/2020 1:55 PM  Type of Therapy:  Gratitude Activity   Participation Level:  Did Not Attend  Participation Quality:  Did not attend  Affect:  Did not attend  Cognitive:  Did not attend  Insight:  Did not attend  Engagement in Therapy:  Did not attend  Modes of Intervention:  Did not attend  Summary of Progress/Problems: Patient did not attend   Aram Beecham 07/26/2020, 1:55 PM

## 2020-07-26 NOTE — Progress Notes (Signed)
[facility] MD Progress Note  07/26/2020 3:40 PM Erik Campbell  MRN:  [id number] Subjective: And interviewed.  Chart reviewed.  Case discussed in detail with members of the treatment team.  Erik Campbell is a 40 year old male with opioid use disorder, cocaine use disorder major depressive disorder and chronic pain who was admitted following an intentional overdose of heroin in a suicide attempt.  On interview today the patient reports his mood is getting better but still a little anxious and depressed.  He reports that his symptoms from opioid withdrawal are improving.  He denies suicidal ideation intent preparation or plan.  He denies thoughts of harming others.  He reports his appetite is good.  The patient denies paranoia or hallucinations.  He continues to experience ringing in his left ear which he finds very irritating.  He states that his family is bringing in his bottle of Ribo-flavonoid that is for ringing in the ears.  Gabapentin has not alleviated ringing in his ears.  The patient reports dizziness and feeling off balance from gabapentin.  He denies other medication side effects.  The patient states that he does not want to take his antidepressant because he does not think it helps and he dislikes taking pills.  He reports that taking pills is a trigger for his addiction since it started after he was prescribed pills following a surgery.  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 Hypertension    History reviewed. No pertinent surgical history. Family History:  History reviewed. No pertinent family history. Family Psychiatric  History: See H&P Social History:  Social History   Substance and Sexual Activity  Alcohol Use Not Currently   Comment: not in the past 30 days      Social History   Substance and Sexual Activity  Drug Use Not Currently  . Types: "Crack" cocaine, Heroin   Comment: not in the past 30 days     Social History   Socioeconomic History  . Marital status: Single    Spouse name: Not on file  . Number of children: Not on file  . Years of education: Not on file  . Highest education level: Not on file  Occupational History  . Not on file  Tobacco Use  . Smoking status: Former Games developer  . Smokeless tobacco: Never Used  Vaping Use  . Vaping Use: Never used  Substance and Sexual Activity  . Alcohol use: Not Currently    Comment: not in the past 30 days   . Drug use: Not Currently    Types: "Crack" cocaine, Heroin    Comment: not in the past 30 days   . Sexual activity: Not Currently    Comment: not in the past 30 days   Other Topics Concern  . Not on file  Social History Narrative  . Not on file   Social Determinants of Health   Financial Resource Strain: Not on file  Food Insecurity: Not on file  Transportation Needs: Not on file  Physical Activity: Not on file  Stress: Not on file  Social Connections: Not on file  Additional Social History:                         Sleep: Fair  Appetite:  Good  Current Medications: Current Facility-Administered Medications  Medication Dose Route Frequency Provider Last Rate Last Admin  . acetaminophen (TYLENOL) tablet 650 mg  650 mg Oral Q6H PRN Maryagnes Amos, FNP      . alum & mag hydroxide-simeth (MAALOX/MYLANTA) 200-200-20 MG/5ML suspension 30 mL  30 mL Oral Q4H PRN Maryagnes Amos, FNP      . buPROPion (WELLBUTRIN XL) 24 hr tablet 150 mg  150 mg Oral Daily Mariel Craft, MD   150 mg at 07/26/20 0855  . [START ON 07/27/2020] cloNIDine  (CATAPRES) tablet 0.1 mg  0.1 mg Oral QAC breakfast Starkes-Perry, Juel Burrow, FNP      . dicyclomine (BENTYL) tablet 20 mg  20 mg Oral Q6H PRN Maryagnes Amos, FNP   20 mg at 07/24/20 1010  . gabapentin (NEURONTIN) capsule 600 mg  600 mg Oral TID Mariel Craft, MD   600 mg at 07/26/20 1314  . hydrOXYzine (ATARAX/VISTARIL) tablet 25 mg  25 mg Oral Q6H PRN Maryagnes Amos, FNP   25 mg at 07/26/20 0856  . lisinopril (ZESTRIL) tablet 30 mg  30 mg Oral Once Maryagnes Amos, FNP      . loperamide (IMODIUM) capsule 2-4 mg  2-4 mg Oral PRN Starkes-Perry, Juel Burrow, FNP      . magnesium hydroxide (MILK OF MAGNESIA) suspension 30 mL  30 mL Oral Daily PRN Starkes-Perry, Juel Burrow, FNP      . methocarbamol (ROBAXIN) tablet 500 mg  500 mg Oral Q8H PRN Maryagnes Amos, FNP   500 mg at 07/26/20 0856  . naproxen (NAPROSYN) tablet 500 mg  500 mg Oral BID PRN Maryagnes Amos, FNP   500 mg at 07/23/20 5400  . nicotine polacrilex (NICORETTE) gum 2 mg  2 mg Oral PRN Jaclyn Shaggy, PA-C   2 mg at 07/26/20 0858  . ondansetron (ZOFRAN-ODT) disintegrating tablet 4 mg  4 mg Oral Q6H PRN Starkes-Perry, Juel Burrow, FNP      . traZODone (DESYREL) tablet 50 mg  50 mg Oral QHS,MR X 1 Laveda Abbe, NP   50 mg at 07/25/20 2203    Lab Results: No results found for this or any previous visit (from the past 48 hour(s)).  Blood Alcohol level:  Lab Results  Component Value Date   ETH <10 07/21/2020   ETH <10 07/01/2020    Metabolic Disorder Labs: Lab Results  Component Value Date   HGBA1C 5.1 03/21/2020   No results found for: PROLACTIN No results found for: CHOL, TRIG, HDL, CHOLHDL, VLDL, LDLCALC  Physical Findings: AIMS: Facial and Oral Movements Muscles of Facial Expression: None, normal Lips and Perioral Area: None, normal Jaw: None, normal Tongue: None, normal,Extremity Movements Upper (arms, wrists, hands, fingers): None, normal Lower (legs, knees, ankles, toes): None,  normal, Trunk Movements Neck, shoulders, hips: None, normal, Overall Severity Severity of abnormal movements (highest score from questions above): None, normal Incapacitation due to abnormal movements: None, normal Patient's awareness of abnormal movements (rate only patient's report): No Awareness, Dental Status Current problems with teeth and/or dentures?: No Does patient usually wear dentures?: No  CIWA:    COWS:  COWS Total Score: 2  Musculoskeletal: Strength & Muscle Tone: within normal limits Gait & Station: Gait not tested today Patient leans: N/A  Psychiatric Specialty Exam:  Presentation  General Appearance: Appropriate for Environment; Casual; Fairly Groomed  Eye Contact:Fair  Speech:Clear and Coherent; Normal Rate  Speech Volume:Normal  Handedness:No data recorded  Mood and Affect  Mood:Anxious  Affect:Constricted   Thought Process  Thought Processes:Coherent; Goal Directed  Descriptions of Associations:Intact  Orientation:Full (Time, Place and Person)  Thought Content:Logical; Other (comment) (No paranoid ideation, referential thinking or delusional content elicited.)  History of Schizophrenia/Schizoaffective disorder:No  Duration of Psychotic Symptoms:No data recorded Hallucinations:Hallucinations: None  Ideas of Reference:None  Suicidal Thoughts:Suicidal Thoughts: No  Homicidal Thoughts:Homicidal Thoughts: No   Sensorium  Memory:Immediate Fair; Recent Fair  Judgment:Fair  Insight:Fair   Executive Functions  Concentration:Fair  Attention Span:Fair  Recall:Fair  Fund of Knowledge:Fair  Language:Good   Psychomotor Activity  Psychomotor Activity:Psychomotor Activity: Normal   Assets  Assets:Communication Skills; Desire for Improvement; Resilience; Social Support   Sleep  Sleep:Sleep: Fair    Physical Exam: Physical Exam Vitals and nursing note reviewed.  Constitutional:      Appearance: Normal appearance.  HENT:      Head: Normocephalic and atraumatic.  Pulmonary:     Effort: Pulmonary effort is normal.  Musculoskeletal:        General: Normal range of motion.     Cervical back: Normal range of motion.  Neurological:     General: No focal deficit present.     Mental Status: He is alert and oriented to person, place, and time.  Psychiatric:        Mood and Affect: Mood is anxious and depressed. Affect is blunt.        Behavior: Behavior is withdrawn.        Thought Content: Thought content is not paranoid or delusional. Thought content does not include homicidal or suicidal ideation.        Judgment: Judgment is impulsive.    Review of Systems  Constitutional: Negative for fever.  HENT: Positive for tinnitus. Negative for sore throat.   Respiratory: Negative for cough and wheezing.   Cardiovascular: Negative for chest pain and palpitations.  Gastrointestinal: Negative for constipation, diarrhea, nausea and vomiting.  Genitourinary: Negative for dysuria.  Musculoskeletal: Negative for back pain and myalgias.  Neurological: Negative for dizziness and headaches.  Psychiatric/Behavioral: Positive for depression. Negative for hallucinations and suicidal ideas. The patient is nervous/anxious and has insomnia.    Blood pressure 118/79, pulse (!) 59, temperature 97.7 F (36.5 C), temperature source Oral, resp. rate 18, height 6' (1.829 m), weight 94.8 kg, SpO2 100 %. Body mass index is 28.35 kg/m.   Treatment Plan Summary: Daily contact with patient to assess and evaluate symptoms and progress in treatment and Medication management   Continue every 15-minute observation level for safety of patient.  We will discontinue Wellbutrin XL as patient states he does not want to take it.  Substance use disorder -Continue on COWS protocol for opioid withdrawal -Continue clonidine taper to manage withdrawal symptoms.  Patient has almost completed taper. -Continue methocarbamol 500 mg every 8 hours as  needed muscle spasms. -We will decrease gabapentin with plan to eventually discontinue due to reported side effects.  Patient states he does not want to take it because he dislikes taking pills.  Unfortunately gabapentin has not helped with tinnitus.  Insomnia -Continue trazodone 50 mg at bedtime as needed for insomnia.  Anxiety -Continue hydroxyzine 25 mg every 6 hours as needed for anxiety  Tinnitus -Gabapentin has not been effective.  Patient dislikes side effects. -Family is bringing in Ribo-flavonoid  tonight -We will consider trial of lamotrigine or baclofen  Encourage patient to be up and out of bed and present in the milieu.  Encourage patient to attend and participate in groups.  Social work is working on Public house manageraftercare plans.  Patient reportedly has bed at rehab facility which will be available on Friday.  I certify that inpatient services furnished can reasonably be expected to improve the patient's condition  Claudie ReveringMartha L James, MD 07/26/2020, 3:40 PM

## 2020-07-26 NOTE — Progress Notes (Signed)
Adult Psychoeducational Group Note  Date:  07/26/2020 Time:  9:14 PM  Group Topic/Focus:  Wrap-Up Group:   The focus of this group is to help patients review their daily goal of treatment and discuss progress on daily workbooks.  Participation Level:  Active  Participation Quality:  Appropriate  Affect:  Appropriate  Cognitive:  Appropriate  Insight: Appropriate  Engagement in Group:  Engaged  Modes of Intervention:  Discussion  Additional Comments:  Patient said his day was 6. His goal for today was to do 50 push ups. He did not achieve his goal. His coping skills was prayer.  Charna Busman Long 07/26/2020, 9:14 PM

## 2020-07-27 MED ORDER — TRAZODONE HCL 50 MG PO TABS
50.0000 mg | ORAL_TABLET | Freq: Every evening | ORAL | Status: DC | PRN
  Filled 2020-07-27: qty 15

## 2020-07-27 MED ORDER — TRAZODONE HCL 50 MG PO TABS: 50.0000 mg | ORAL_TABLET | Freq: Every evening | ORAL | 0 refills | Status: DC | PRN

## 2020-07-27 MED ORDER — NICOTINE POLACRILEX 2 MG MT GUM: 2.0000 mg | CHEWING_GUM | OROMUCOSAL | 0 refills | Status: DC | PRN

## 2020-07-27 MED ORDER — LIPOFLAVONOID PO TABS: 2.0000 | ORAL_TABLET | Freq: Three times a day (TID) | ORAL | Status: DC

## 2020-07-27 MED ORDER — WHITE PETROLATUM EX OINT
TOPICAL_OINTMENT | CUTANEOUS | Status: AC
  Filled 2020-07-27: qty 5

## 2020-07-27 MED ORDER — LISINOPRIL 30 MG PO TABS: 30.0000 mg | ORAL_TABLET | Freq: Every day | ORAL | 0 refills | Status: DC

## 2020-07-27 MED ORDER — LISINOPRIL 10 MG PO TABS
30.0000 mg | ORAL_TABLET | Freq: Every day | ORAL | Status: DC
  Administered 2020-07-27: 30 mg via ORAL
  Filled 2020-07-27 (×2): qty 3
  Filled 2020-07-27: qty 6

## 2020-07-27 NOTE — Discharge Summary (Signed)
Physician Discharge Summary Note  Patient:  Erik Campbell is an 40 y.o., male MRN:  [id number] DOB:  Nov 06, 1980 Patient phone:  [phone] (home)  Patient address:   [address] Erik Campbell [place] [address],  Total Time spent with patient: Greater than 30 minutes                                              Date of Admission:  07/22/2020  Date of Discharge: 07-27-20  Reason for Admission: Suicide attempt by inhaling half a gram of heroin.  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  Past Medical History:  Past Medical History:  Diagnosis Date  . Hypertension    History reviewed. No pertinent surgical history.  Family History: History reviewed. No pertinent family history.  Family Psychiatric  History: See H&P  Social History:  Social History   Substance and Sexual Activity  Alcohol Use Not Currently   Comment: not in the past 30 days      Social History   Substance and Sexual Activity  Drug Use Not Currently  . Types: "Crack" cocaine, Heroin   Comment: not in the past 30 days     Social History   Socioeconomic History  . Marital status: Single    Spouse name: Not on file  . Number of children: Not on file  . Years of education: Not on file  . Highest education level: Not on file  Occupational History  . Not on file  Tobacco Use  . Smoking status: Former Games developer  . Smokeless tobacco: Never Used  Vaping Use  . Vaping Use: Never used  Substance and Sexual Activity  . Alcohol use: Not Currently    Comment: not in the past 30 days   . Drug use: Not Currently    Types: "Crack" cocaine, Heroin    Comment: not in the past 30  days   . Sexual activity: Not Currently    Comment: not in the past 30 days   Other Topics Concern  . Not on file  Social History Narrative  . Not on file   Social Determinants of Health   Financial Resource Strain: Not on file  Food Insecurity: Not on file  Transportation Needs: Not on file  Physical Activity: Not on file  Stress: Not on file  Social Connections: Not on file   Hospital Course: (Per Md's admission evaluation notes): Patient reports worsening depressive symptoms to include isolation, withdrawn, guilty, hopelessness, and overall feeling that he will not be able to quit using substances. Patient was recently released from [place] in [place], a few months ago however has recently relapsed. Yesterday he snorted about a half a gram of heroin in an attempt to end his life. He usually uses about 1/4-1/2 a gram a day, however took it all at once. Urine drug screen was positive for opiates and cocaine. He reports his heroin was most likely couple of cocaine although prior drug screen do show cocaine use. He denies any current withdrawal symptoms, with the exception of excessive sweating. He has a previous  diagnosis of major depression, polysubstance abuse, and 3 previous suicide attempts by overdose within the last year. Patient is alert and oriented, calm and cooperative, very pleasant. Patient continues to endorse depressive symptoms to include suicidal ideations while in the emergency room. He expresses some difficulty with contracting for safety, as he feels very hopeless. Patient does not appear to be responding to internal or external stimuli. He does not display any delusional thinking, as he is able to answer questions appropriately.Patient continues to express suicidal intent, and is a high risk for suicide completion at this time.Patient will benefit from inpatient hospitalization and medication management.   Although with what seem like an extensive hx of  possible  polysubstance use disorders & including mental health issues, this is Erik Campbell's first psychiatric admission/discharge summary from this [facility]. He was admitted with complaint of  worsening depressive symptoms that included self isolation, withdrawn, guilt, hopelessness, and overall feeling that he will not be able to quit using substance use. He attempted to kill himself by snorting half a gram of heroin. He was brought to the [facility] for evaluation & treatment.   After evaluation of his presenting symptoms, Erik Campbell was recommended for mood stabilization & opioid detoxification treatments by his treatment team. And with his consent he received & was stabilized on the medications that targeted his withdrawal/depressive symptoms. He was also enrolled & participated in the group counseling sessions being offered & held on this unit. He learned coping skills. He presented other significant pre-existing medical conditions that required treatment or monitoring. He was resumed & discharged on his pertinent home medications for those medical ailments. He tolerated his treatment regimen without any adverse effects or reactions reported. Part of his discharge plan included a referral to an outpatient psychiatric services for medication management, routine psychiatric care & a residential treatment center to continue further substance abuse treatment. Today during his discharge evaluation, Erik Campbell declined to be on some of the medications that helped stabilize his symptoms (gabapentin & Vistaril). He states that he does not want to be or depend on medications for the rest of his life. He only agreed to discharge on Trazodone for sleep, Lisinopril for his blood pressure & his personal medication that he brought with him to the hospital. His symptoms did respond to his treatment regimen warranting this discharge.  During the course of his hospitalization, the 15-minute checks were adequate to ensure Erik Campbell's safety. Patient did not  display any dangerous, violent or suicidal behavior on the unit.  He interacted with patients & staff appropriately. He participated appropriately in the group sessions/therapies. His medications were addressed & adjusted to meet his needs. He was recommended for an outpatient follow-up care, medication management & further substance abuse treatment program upon discharge to assure his continuity of care.  At the time of discharge, patient is not reporting any acute suicidal/homicidal ideations. He feels more confident about his self care, mental health care & substance abuse treatment. He currently denies any new issues or concerns. Education and supportive counseling provided throughout her hospital stay & upon discharge.   Today upon his discharge evaluation with the attending psychiatrist, Shah shares he is doing well. He denies any other specific concerns. He is sleeping well. His appetite is good. He denies other physical complaints. He denies AH/VH, delusional thoughts or paranoia. He feels that he has benefited from this hospitalization. He was able to engage in safety planning including plan to return to [facility] or contact emergency services if he feels unable  to maintain his own safety or the safety of others. Pt had no further questions, comments, or concerns. He left [facility] with all personal belongings in no apparent distress. Transportation per the [employer].  Physical Findings: AIMS: Facial and Oral Movements Muscles of Facial Expression: None, normal Lips and Perioral Area: None, normal Jaw: None, normal Tongue: None, normal,Extremity Movements Upper (arms, wrists, hands, fingers): None, normal Lower (legs, knees, ankles, toes): None, normal, Trunk Movements Neck, shoulders, hips: None, normal, Overall Severity Severity of abnormal movements (highest score from questions above): None, normal Incapacitation due to abnormal movements: None, normal Patient's awareness of abnormal  movements (rate only patient's report): No Awareness, Dental Status Current problems with teeth and/or dentures?: No Does patient usually wear dentures?: No  CIWA:    COWS:  COWS Total Score: 1  Musculoskeletal: Strength & Muscle Tone: within normal limits Gait & Station: normal Patient leans: N/A  Psychiatric Specialty Exam:  Presentation  General Appearance: Appropriate for Environment; Casual; Fairly Groomed  Eye Contact:Fair  Speech:Clear and Coherent; Normal Rate  Speech Volume:Normal  Handedness:No data recorded  Mood and Affect  Mood:Anxious  Affect:Constricted  Thought Process  Thought Processes:Coherent; Goal Directed  Descriptions of Associations:Intact  Orientation:Full (Time, Place and Person)  Thought Content:Logical; Other (comment) (No paranoid ideation, referential thinking or delusional content elicited.)  History of Schizophrenia/Schizoaffective disorder:No  Duration of Psychotic Symptoms:No data recorded Hallucinations:Hallucinations: None  Ideas of Reference:None  Suicidal Thoughts:Suicidal Thoughts: No  Homicidal Thoughts:Homicidal Thoughts: No  Sensorium  Memory:Immediate Fair; Recent Fair  Judgment:Fair  Insight:Fair  Executive Functions  Concentration:Fair  Attention Span:Fair  Recall:Fair  Fund of Knowledge:Fair  Language:Good  Psychomotor Activity  Psychomotor Activity:Psychomotor Activity: Normal  Assets  Assets:Communication Skills; Desire for Improvement; Resilience; Social Support  Sleep  Sleep:Sleep: Fair  Physical Exam: Physical Exam Vitals and nursing note reviewed.  HENT:     Head: Normocephalic.     Nose: Nose normal.     Mouth/Throat:     Pharynx: Oropharynx is clear.  Eyes:     Pupils: Pupils are equal, round, and reactive to light.  Neck:     Comments: Deferred Cardiovascular:     Pulses: Normal pulses.     Comments: Hx. HTN Pulmonary:     Effort: Pulmonary effort is normal.   Musculoskeletal:        General: Normal range of motion.     Cervical back: Normal range of motion.  Skin:    General: Skin is warm and dry.  Neurological:     General: No focal deficit present.     Mental Status: He is alert and oriented to person, place, and time. Mental status is at baseline.    Review of Systems  Constitutional: Negative for chills, diaphoresis, fever and malaise/fatigue.  HENT: Negative for congestion and sore throat.   Eyes: Negative for blurred vision.  Respiratory: Negative for cough, shortness of breath and wheezing.   Cardiovascular: Negative for chest pain, palpitations and leg swelling.  Gastrointestinal: Negative for abdominal pain, constipation, diarrhea, heartburn, nausea and vomiting.  Genitourinary: Negative for dysuria.  Musculoskeletal: Negative for joint pain and myalgias.  Skin: Negative.   Neurological: Negative for dizziness, tingling, tremors, sensory change, speech change, focal weakness, seizures, loss of consciousness, weakness and headaches.  Endo/Heme/Allergies: Negative for environmental allergies. Does not bruise/bleed easily.       Allergie: NKDA  Psychiatric/Behavioral: Positive for substance abuse (Hx. Polysubstance use disorders). Negative for depression, hallucinations, memory loss and suicidal ideas. The patient  has insomnia (Stabilized with medication prior to discharge). The patient is not nervous/anxious (Stable upon discharge).    Blood pressure (!) 134/99, pulse (!) 58, temperature 97.7 F (36.5 C), temperature source Oral, resp. rate 18, height 6' (1.829 m), weight 94.8 kg, SpO2 99 %. Body mass index is 28.35 kg/m.  Has this patient used any form of tobacco in the last 30 days? (Cigarettes, Smokeless Tobacco, Cigars, and/or Pipes): Yes, an FDA-approved tobacco cessation medication was recommended at discharge.  Blood Alcohol level:  Lab Results  Component Value Date   ETH <10 07/21/2020   ETH <10 07/01/2020   Metabolic  Disorder Labs:  Lab Results  Component Value Date   HGBA1C 5.1 03/21/2020   No results found for: PROLACTIN No results found for: CHOL, TRIG, HDL, CHOLHDL, VLDL, LDLCALC  See Psychiatric Specialty Exam and Suicide Risk Assessment completed by Attending Physician prior to discharge.  Discharge destination:  Home  Is patient on multiple antipsychotic therapies at discharge:  No   Has Patient had three or more failed trials of antipsychotic monotherapy by history:  No  Recommended Plan for Multiple Antipsychotic Therapies: NA  Allergies as of 07/27/2020   No Known Allergies     Medication List    TAKE these medications     Indication  Lipoflavonoid Tabs Take 2 tablets by mouth 3 (three) times daily. (Patient's own medication): For vitamin supplementation  Indication: Vitamin Deficiency   lisinopril 30 MG tablet Commonly known as: ZESTRIL Take 1 tablet (30 mg total) by mouth daily. For high blood pressure What changed: additional instructions  Indication: High Blood Pressure Disorder   nicotine polacrilex 2 MG gum Commonly known as: NICORETTE Take 1 each (2 mg total) by mouth as needed. (May buy from over the counter): For smoking cessation  Indication: Nicotine Addiction   traZODone 50 MG tablet Commonly known as: DESYREL Take 1 tablet (50 mg total) by mouth at bedtime as needed for sleep.  Indication: Trouble Sleeping       Follow-up Information    Services, [facility] Follow up.   Why: referral made.  You have a walk in appointment for therapy services on     .  You also have a walk in appointment for medication management on   .  Walk in appointments are first come, first served and are held in person.  Contact information: [address] [place] [address] [phone]        [facility]. Go on 08/04/2020.   Specialty: Behavioral Health Why: You have a walk in appointment for therapy services on 08/04/20 at 12:00 pm.   You also have an appointment on 08/22/20 at 7:45 am. Walk in appointments are first come, first served and are held in person.  Contact information: [address] [place] [address] [phone]             Follow-up recommendations: Activity:  As tolerated Diet: As recommended by your primary care doctor. Keep all scheduled follow-up appointments as recommended.   Comments: Prescriptions given at discharge.  Patient agreeable to plan.  Given opportunity to ask questions.  Appears to feel comfortable with discharge denies any current suicidal or homicidal thought. Patient is also instructed prior to discharge to: Take all medications as prescribed by his/her mental healthcare provider. Report any adverse effects and or reactions from the medicines to his/her outpatient provider promptly. Patient has been instructed & cautioned: To not engage in alcohol and or  illegal drug use while on prescription medicines. In the event of worsening symptoms, patient is instructed to call the crisis hotline, 911 and or go to the nearest ED for appropriate evaluation and treatment of symptoms. To follow-up with his/her primary care provider for your other medical issues, concerns and or health care needs.  Signed: Armandina StammerAgnes Nwoko, NP, PMHNP, FNP-BC 07/27/2020, 4:15 PM

## 2020-07-27 NOTE — BHH Group Notes (Signed)
The focus of this group is to help patients establish daily goals to achieve during treatment and discuss how the patient can incorporate goal setting into their daily lives to aide in recovery.   Patient did not attend group (discharging today)

## 2020-07-27 NOTE — Progress Notes (Signed)
  [facility] Adult Case Management Discharge Plan :  Will you be returning to the same living situation after discharge:  No. Parents home and then [facility] in [place].  At discharge, do you have transportation home?: Yes,  Safe Transport Do you have the ability to pay for your medications: Yes,  Family   Release of information consent forms completed and in the chart;  Patient's signature needed at discharge.  Patient to Follow up at:  Follow-up Information    Services, [facility] Follow up.   Why: referral made Contact information: [address] [place] [address] [phone]        [facility] Follow up.   Specialty: Behavioral Health Why: walk in appointments for therapy and med mgmt Contact information: [address] [place] [address] [phone]              Next level of care provider has access to [facility] Link:yes  Safety Planning and Suicide Prevention discussed: Yes,  with patient and mother      Has patient been referred to the Quitline?: Patient refused referral  Patient has been referred for addiction treatment: Yes  Aram Beecham, LCSWA 07/27/2020, 9:01 AM

## 2020-07-27 NOTE — BHH Suicide Risk Assessment (Signed)
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  HENT: Positive for rhinorrhea, sneezing and tinnitus. Negative for sore throat.   Respiratory: Negative for cough and shortness of breath.   Cardiovascular: Negative for chest pain and palpitations.  Gastrointestinal: Negative for constipation, diarrhea, nausea and vomiting.  Genitourinary: Negative for difficulty urinating.  Musculoskeletal: Negative for arthralgias and back pain.  Neurological: Negative for dizziness, tremors and headaches.  Psychiatric/Behavioral: Positive for sleep disturbance. Negative for behavioral problems, dysphoric mood, hallucinations, self-injury and suicidal ideas. The patient is not nervous/anxious.     Blood pressure (!) 134/99, pulse (!) 58, temperature 97.7 F (36.5 C), temperature source Oral, resp. rate 18, height 6' (1.829 m), weight 94.8 kg, SpO2 99 %.Body mass index is 28.35 kg/m.  General Appearance: Casual and Appropriate for the environment  Eye Contact::  Good  Speech:  Clear and Coherent and Normal Rate409  Volume:  Normal  Mood:  Euthymic and "Good"  Affect:  Appropriate and Congruent  Thought Process:  Coherent, Goal Directed and Linear  Orientation:  Full (Time, Place, and Person)  Thought Content:  Logical and No paranoid ideation, no referential thinking, no delusional content elicited.  No hallucinations.  Suicidal Thoughts:  No  Homicidal  Thoughts:  No  Memory:  Immediate;   Good Recent;   Good Remote;   Good  Judgement:  Fair  Insight:  Fair  Psychomotor Activity:  Normal  Concentration:  Good  Recall:  Good  Fund of Knowledge:Fair  Language: Good  Akathisia:  No  Handed:  Right  AIMS (if indicated):    Assets:  Communication Skills Desire for Improvement Housing Resilience Social Support  Sleep:  Number of Hours: 3.5  Cognition: WNL  ADL's:  Intact   Mental Status Per Nursing Assessment::   On Admission:  Self-harm behaviors  Demographic Factors:  Male and Unemployed  Loss Factors: Legal issues and Financial problems/change in socioeconomic status  Historical Factors: Prior suicide attempts and Impulsivity  Risk Reduction Factors:   Living with another person, especially a relative and Positive social support  Continued Clinical Symptoms:  Alcohol/Substance Abuse/Dependencies Previous Psychiatric Diagnoses and Treatments  Cognitive Features That Contribute To Risk:  None    Suicide Risk:  Mild: There is no current suicidal ideation intention preparation or plan but patient made an attempt prior to the current admission.  There there is no active suicidal ideation, there are no passive wishes for death, no identifiable plans to harm self, no associated intent, mood is good, patient is demonstrated good self-control (both objective and subjective assessment) on the unit but has a history of impulsivity.  There are some other risk factors (male gender, history of substance use, history of past suicide attempt, etc.), but there are identifiable protective factors, including available and accessible social support, future oriented outlook, etc.   Follow-up Information    Services, [facility] Follow up.   Why: referral made.  You have a walk in appointment for therapy services on     .  You also have a walk in appointment for medication management on   .  Walk in appointments are first come, first  served and are held in person.  Contact information: [address] [place] [address] [phone]        [facility] Follow up.   Specialty: Behavioral Health Why: walk in appointments for therapy and med mgmt Contact information: [address] [place] [address] [phone]              Plan Of Care/Follow-up recommendations:  Activity:  As tolerated Other:  Do not use drugs or alcohol.  Attend residential substance use disorder rehabilitation program.  Keep outpatient psychiatric, psychotherapy, primary care and addiction counselor appointments.  Attend NA.  Claudie Revering, MD 07/27/2020, 9:45 AM

## 2020-07-27 NOTE — Progress Notes (Signed)
RN met with pt and reviewed pt's discharge instructions.  Pt verbalized understanding of discharge instructions and pt did not have any questions. RN reviewed and provided pt with a copy of SRA, AVS and Transition Record.  RN returned pt's belongings to pt. Paper medication prescriptions and samples were given to pt.  Pt denied SI/HI/AVH and voiced no concerns.  Pt was appreciative of the care pt received at [facility].  RN assisted pt with his belongings in [employer] car.  Pt is scheduled to go [facility] tomorrow and verbalized understanding that he needs to be at [facility] at 7:45am.

## 2020-09-13 ENCOUNTER — Inpatient Hospital Stay: Payer: Self-pay | Admitting: Family Medicine

## 2021-01-12 ENCOUNTER — Emergency Department (HOSPITAL_COMMUNITY): Payer: Self-pay

## 2021-01-12 ENCOUNTER — Inpatient Hospital Stay (HOSPITAL_COMMUNITY)
Admission: EM | Admit: 2021-01-12 | Discharge: 2021-01-14 | DRG: 917 | Disposition: A | Discharge status: Home / Self Care | Payer: Self-pay | Attending: Internal Medicine | Admitting: Internal Medicine

## 2021-01-12 ENCOUNTER — Encounter (HOSPITAL_COMMUNITY): Payer: Self-pay | Admitting: Family Medicine

## 2021-01-12 ENCOUNTER — Other Ambulatory Visit: Payer: Self-pay

## 2021-01-12 DIAGNOSIS — J9601 Acute respiratory failure with hypoxia: Secondary | ICD-10-CM

## 2021-01-12 DIAGNOSIS — T50901A Poisoning by unspecified drugs, medicaments and biological substances, accidental (unintentional), initial encounter: Principal | ICD-10-CM

## 2021-01-12 DIAGNOSIS — T40411A Poisoning by fentanyl or fentanyl analogs, accidental (unintentional), initial encounter: Principal | ICD-10-CM | POA: Diagnosis present

## 2021-01-12 DIAGNOSIS — Z20822 Contact with and (suspected) exposure to covid-19: Secondary | ICD-10-CM | POA: Diagnosis present

## 2021-01-12 DIAGNOSIS — N179 Acute kidney failure, unspecified: Secondary | ICD-10-CM

## 2021-01-12 DIAGNOSIS — F17290 Nicotine dependence, other tobacco product, uncomplicated: Secondary | ICD-10-CM | POA: Diagnosis present

## 2021-01-12 DIAGNOSIS — I10 Essential (primary) hypertension: Secondary | ICD-10-CM | POA: Diagnosis present

## 2021-01-12 DIAGNOSIS — Z6831 Body mass index (BMI) 31.0-31.9, adult: Secondary | ICD-10-CM

## 2021-01-12 DIAGNOSIS — Z79899 Other long term (current) drug therapy: Secondary | ICD-10-CM

## 2021-01-12 DIAGNOSIS — E669 Obesity, unspecified: Secondary | ICD-10-CM | POA: Diagnosis present

## 2021-01-12 DIAGNOSIS — Z9151 Personal history of suicidal behavior: Secondary | ICD-10-CM

## 2021-01-12 DIAGNOSIS — F1410 Cocaine abuse, uncomplicated: Secondary | ICD-10-CM | POA: Diagnosis present

## 2021-01-12 LAB — COMPREHENSIVE METABOLIC PANEL
ALT: 24 U/L (ref 0–44)
AST: 32 U/L (ref 15–41)
Albumin: 4.6 g/dL (ref 3.5–5.0)
Alkaline Phosphatase: 53 U/L (ref 38–126)
Anion gap: 9 (ref 5–15)
BUN: 16 mg/dL (ref 6–20)
CO2: 29 mmol/L (ref 22–32)
Calcium: 9 mg/dL (ref 8.9–10.3)
Chloride: 101 mmol/L (ref 98–111)
Creatinine, Ser: 1.43 mg/dL — ABNORMAL HIGH (ref 0.61–1.24)
GFR, Estimated: >60 mL/min (ref >60)
Glucose, Bld: 109 mg/dL — ABNORMAL HIGH (ref 70–99)
Potassium: 4.6 mmol/L (ref 3.5–5.1)
Sodium: 139 mmol/L (ref 135–145)
Total Bilirubin: 0.7 mg/dL (ref 0.3–1.2)
Total Protein: 7.2 g/dL (ref 6.5–8.1)

## 2021-01-12 LAB — RAPID URINE DRUG SCREEN, HOSP PERFORMED
Amphetamines: NOT DETECTED
Barbiturates: NOT DETECTED
Benzodiazepines: NOT DETECTED
Cocaine: POSITIVE — AB
Opiates: NOT DETECTED
Tetrahydrocannabinol: NOT DETECTED

## 2021-01-12 LAB — CBC WITH DIFFERENTIAL/PLATELET
Abs Immature Granulocytes: 0.04 10*3/uL (ref 0.00–0.07)
Basophils Absolute: 0 10*3/uL (ref 0.0–0.1)
Basophils Relative: 0 %
Eosinophils Absolute: 0 10*3/uL (ref 0.0–0.5)
Eosinophils Relative: 0 %
HCT: 48.9 % (ref 39.0–52.0)
Hemoglobin: 16.1 g/dL (ref 13.0–17.0)
Immature Granulocytes: 0 %
Lymphocytes Relative: 9 %
Lymphs Abs: 0.8 10*3/uL (ref 0.7–4.0)
MCH: 30.3 pg (ref 26.0–34.0)
MCHC: 32.9 g/dL (ref 30.0–36.0)
MCV: 91.9 fL (ref 80.0–100.0)
Monocytes Absolute: 0.6 10*3/uL (ref 0.1–1.0)
Monocytes Relative: 6 %
Neutro Abs: 7.9 10*3/uL — ABNORMAL HIGH (ref 1.7–7.7)
Neutrophils Relative %: 85 %
Platelets: 257 10*3/uL (ref 150–400)
RBC: 5.32 MIL/uL (ref 4.22–5.81)
RDW: 13.1 % (ref 11.5–15.5)
WBC: 9.4 10*3/uL (ref 4.0–10.5)
nRBC: 0 % (ref 0.0–0.2)

## 2021-01-12 LAB — ACETAMINOPHEN LEVEL: Acetaminophen (Tylenol), Serum: <10 ug/mL — ABNORMAL LOW (ref 10–30)

## 2021-01-12 LAB — CBG MONITORING, ED: Glucose-Capillary: 121 mg/dL — ABNORMAL HIGH (ref 70–99)

## 2021-01-12 LAB — ETHANOL: Alcohol, Ethyl (B): <10 mg/dL (ref <10)

## 2021-01-12 LAB — SALICYLATE LEVEL: Salicylate Lvl: <7 mg/dL — ABNORMAL LOW (ref 7.0–30.0)

## 2021-01-12 MED ORDER — ACETAMINOPHEN 650 MG RE SUPP: 650.0000 mg | Freq: Four times a day (QID) | RECTAL | Status: DC | PRN

## 2021-01-12 MED ORDER — NALOXONE HCL 4 MG/10ML IJ SOLN
0.2500 mg/h | INTRAVENOUS | Status: DC
  Administered 2021-01-12 – 2021-01-14 (×3): 0.25 mg/h via INTRAVENOUS
  Filled 2021-01-12 (×3): qty 10

## 2021-01-12 MED ORDER — NICOTINE 14 MG/24HR TD PT24
14.0000 mg | MEDICATED_PATCH | Freq: Every day | TRANSDERMAL | Status: DC
  Administered 2021-01-13: 14 mg via TRANSDERMAL
  Filled 2021-01-12 (×2): qty 1

## 2021-01-12 MED ORDER — LACTATED RINGERS IV SOLN: INTRAVENOUS | Status: DC

## 2021-01-12 MED ORDER — ACETAMINOPHEN 325 MG PO TABS: 650.0000 mg | ORAL_TABLET | Freq: Four times a day (QID) | ORAL | Status: DC | PRN

## 2021-01-12 MED ORDER — NALOXONE HCL 2 MG/2ML IJ SOSY
PREFILLED_SYRINGE | INTRAMUSCULAR | Status: AC
  Administered 2021-01-12: 2 mg
  Filled 2021-01-12: qty 2

## 2021-01-12 NOTE — ED Notes (Signed)
Patients O2 level noted to be 75% RA. Pt placed on 2L [place]. O2 levels 95%

## 2021-01-12 NOTE — ED Provider Notes (Signed)
[facility]-EMERGENCY DEPT Provider Note   CSN: [id number] Arrival date & time: 01/12/21  1832     History Chief Complaint  Patient presents with   Drug Overdose    OTHEL HOOGENDOORN is a 40 y.o. male.  DUKE WEISENSEL presents with an accidental overdose of what he thinks might have been fentanyl.  He states that he bought dope.  However, after developing respiratory depression, he thinks he might of been fentanyl.  He snorted this, and he also used some cocaine.  He received 2 mg of Narcan intranasally and 2 mg IV.  He is currently alert and awake.  He has no complaints except that he wants to leave the emergency department.  He says that he is undergoing some life stressors, but he denies depression, suicidal ideation.  He is not currently receiving mental health care but does attend meetings for his substance use disorder.  He was admitted in March of this year after an intentional overdose.  The history is provided by the patient.  Drug Overdose This is a new problem. The current episode started 1 to 2 hours ago. The problem occurs constantly. The problem has been resolved. Pertinent negatives include no chest pain, no abdominal pain, no headaches and no shortness of breath. Nothing aggravates the symptoms. Relieved by: narcan. Treatments tried: narcan. The treatment provided significant relief.      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     No family history on file.  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 Authorizing Provider  lisinopril (ZESTRIL) 30 MG tablet Take 1 tablet (30 mg total) by mouth daily. For high blood pressure 07/27/20   Armandina Stammer I, NP  nicotine polacrilex (NICORETTE) 2 MG gum Take 1 each (2 mg total) by mouth as needed. (May buy from over the counter): For smoking cessation 07/27/20   Armandina Stammer I, NP  traZODone (DESYREL) 50 MG tablet Take 1 tablet (50 mg total) by mouth at bedtime as needed for sleep. 07/27/20   Armandina Stammer I, NP  Vitamins-Lipotropics (LIPOFLAVONOID) TABS Take 2 tablets by mouth 3 (three) times daily. (Patient's own medication): For vitamin supplementation 07/27/20   Sanjuana Kava, NP    Allergies    Patient has no known allergies.  Review of Systems   Review of Systems  Constitutional:  Negative for chills and fever.  HENT:  Negative for ear pain and sore throat.   Eyes:  Negative for pain and visual disturbance.  Respiratory:  Negative for cough and shortness of breath.   Cardiovascular:  Negative for chest pain and palpitations.  Gastrointestinal:  Negative for abdominal pain and vomiting.  Genitourinary:  Negative for dysuria and hematuria.  Musculoskeletal:  Negative for arthralgias and back pain.  Skin:  Negative for color change and rash.  Neurological:  Negative for seizures, syncope and headaches.  All other systems reviewed and are negative.  Physical Exam Updated Vital Signs BP (!) 154/104 (BP Location: Right Arm)   Pulse 63   Temp 98.2 F (36.8 C) (Oral)   Resp 18   Ht 6' (1.829 m)   Wt 104.3 kg   SpO2 98%   BMI 31.19 kg/m   Physical Exam Vitals and nursing note reviewed.  Constitutional:      Appearance:  Normal appearance.  HENT:     Head: Normocephalic and atraumatic.  Eyes:     Conjunctiva/sclera: Conjunctivae normal.  Pulmonary:     Effort: Pulmonary effort is normal. No respiratory distress.  Musculoskeletal:        General: No deformity. Normal range of motion.     Cervical back: Normal range of motion.  Skin:    General: Skin is warm and dry.  Neurological:     General: No focal deficit present.     Mental Status: He is alert and oriented to person, place, and time. Mental status is at baseline.  Psychiatric:        Attention and Perception: Attention normal.        Mood and Affect: Affect is angry.        Speech: Speech normal.        Behavior: Behavior is agitated.        Thought Content: Thought content normal.        Cognition and Memory: Cognition normal.        Judgment: Judgment is impulsive.    ED Results / Procedures / Treatments   Labs (all labs ordered are listed, but only abnormal results are displayed) Labs Reviewed  COMPREHENSIVE METABOLIC PANEL - Abnormal; Notable for the following components:      Result Value   Glucose, Bld 109 (*)    Creatinine, Ser 1.43 (*)    All other components within normal limits  SALICYLATE LEVEL - Abnormal; Notable for the following components:   Salicylate Lvl <7.0 (*)    All other components within normal limits  ACETAMINOPHEN LEVEL - Abnormal; Notable for the following components:   Acetaminophen (Tylenol), Serum <10 (*)    All other components within normal limits  RAPID URINE DRUG SCREEN, HOSP PERFORMED - Abnormal; Notable for the following components:   Cocaine POSITIVE (*)    All other components within normal limits  CBC WITH DIFFERENTIAL/PLATELET - Abnormal; Notable for the following components:   Neutro Abs 7.9 (*)    All other components within normal limits  CBG MONITORING, ED - Abnormal; Notable for the following components:   Glucose-Capillary 121 (*)    All other components within normal limits  RESP  PANEL BY RT-PCR (FLU A&B, COVID) [id number]  ETHANOL    EKG EKG Interpretation  Date/Time:  Friday January 12 2021 20:45:49 EDT Ventricular Rate:  71 PR Interval:  159 QRS Duration: 83 QT Interval:  384 QTC Calculation: 418 R Axis:   84 Text Interpretation: Sinus rhythm Borderline T abnormalities, inferior leads Baseline wander in lead(s) V6 T wave inversion/flattening in III and aVF worse than on 07/21/20 Confirmed by Pieter Partridge ([id number]) on 01/12/2021 8:48:06 PM  Radiology DG Chest Port 1 View  Result Date: 01/12/2021 CLINICAL DATA:  Shortness of  breath. EXAM: PORTABLE CHEST 1 VIEW COMPARISON:  None. FINDINGS: The heart size and mediastinal contours are within normal limits. Both lungs are clear. The visualized skeletal structures are unremarkable. IMPRESSION: No active disease. Electronically Signed   By: Elgie Collard M.D.   On: 01/12/2021 22:19    Procedures .Critical Care  Date/Time: 01/12/2021 11:37 PM Performed by: Koleen Distance, MD Authorized by: Koleen Distance, MD   Critical care provider statement:    Critical care time (minutes):  45   Critical care time was exclusive of:  Separately billable procedures and treating other patients and teaching time   Critical care was necessary to treat or prevent imminent or life-threatening deterioration of the following conditions:  Respiratory failure   Critical care was time spent personally by me on the following activities:  Development of treatment plan with patient or surrogate, discussions with consultants, evaluation of patient's response to treatment, examination of patient, obtaining history from patient or surrogate, ordering and performing treatments and interventions, ordering and review of laboratory studies, ordering and review of radiographic studies, pulse oximetry, re-evaluation of patient's condition and review of old charts   I assumed direction of critical care for this patient from another provider in my specialty: no      Care discussed with: admitting provider     Medications Ordered in ED Medications  naloxone HCl (NARCAN) 4 mg in dextrose 5 % 250 mL infusion (0.25 mg/hr Intravenous New Bag/Given 01/12/21 2100)  naloxone [facility]) 2 MG/2ML injection (2 mg  Given 01/12/21 2027)    ED Course  I have reviewed the triage vital signs and the nursing notes.  Pertinent labs & imaging results that were available during my care of the patient were reviewed by me and considered in my medical decision making (see chart for details).  Clinical Course as of 01/12/21 2335  Caleen Essex Jan 12, 2021  2035 This patient was initially quite adamant that he was leaving the emergency department.  He wanted no interventions, and he said he was willing to take the risk of death if he left.  I convinced him to stay for at least 1 hour, and during that time period, he has required supplemental oxygen.  He has inadvertently pushed the nasal cannula off of his face multiple times with resulting desaturations into the 40s and 50s.  He was given another dose of Narcan and started on a drip.  End-tidal CO2 monitoring was initiated.  Work-up was extended in anticipation for observation admission. [AW]  2216 I spoke with Dr. Rachael Darby regarding admission. [AW]    Clinical Course User Index [AW] Koleen Distance, MD   MDM Rules/Calculators/A&P                           Deeann Cree presents after an unintentional overdose of a likely opioid.  He also admits to using cocaine.  He was given Narcan x2 with EMS.  He was noted to require supplemental oxygen and ultimately more Narcan as well as a Narcan drip here.  He was also hypertensive at times during his ED course which could be attributed to opioid withdrawal and/or cocaine use.  ED evaluation was otherwise unremarkable.  He will be admitted for further observation given that he is requiring a Narcan drip.  I did ask him extensively about any suicidal ideation or acute mental health emergency.   He denied these, and is open to resuming his Narcotics Anonymous  meetings when he is discharged. Final Clinical Impression(s) / ED Diagnoses Final diagnoses:  Accidental drug overdose, initial encounter    Rx / DC Orders ED Discharge Orders     None        Koleen DistanceWright, Baer Hinton G, MD 01/12/21 2338

## 2021-01-12 NOTE — H&P (Signed)
History and Physical    Erik Campbell KVQ:[id number] DOB: 1981/04/20 DOA: 01/12/2021  PCP: Jackie Plum, MD   Patient coming from: Home  Chief Complaint: respiratory failure  HPI: Erik Campbell is a 40 y.o. male with medical history significant for HTN, polysubstance abuse who presents by EMS with accidental overdose with respiratory depression.  He reports that he had bought some cocaine and what he thought was to be heroin and snorted it.  He does not remember what happened after that.  Someone is with had called EMS and when they arrived he had respiratory depression and low oxygen saturation and was given Narcan intranasally and then given an IV.  He became awake and alert and after arrival in the emergency room he was being monitored.  During that time he became lethargic and had periods of apnea and oxygen desaturation so he was given more Narcan and eventually put on a Narcan infusion.  He reports he has a history of polysubstance use but he quit back in March of this year after he had an intentional overdose.  He reports that lately he has had a lot of stress in his life so he decided to use some cocaine and drugs today.  He denies any depression or suicidal ideation.  He states that he does go to [facility] but is currently not being followed by mental health.   ED Course:  Patient initially had elevated blood pressures which have improved.  He is on Narcan infusion and oxygen by nasal cannula.  He is alert and oriented at this time without complaints.  Mild AKI with a creatinine of 1.43 on his labs.  Remainder of CMP is within normal limits.  CBC is unremarkable.  UDS was positive for cocaine.  Chest x-ray is negative.  Covid swab is pending.  Hospitalist services been asked to admit for further management  Review of Systems:  General: Denies weakness, fever, chills, weight loss, night sweats.  Denies dizziness.  Denies change in appetite HENT: Denies head trauma, headache, denies  change in hearing, tinnitus.  Denies nasal congestion or bleeding.  Denies sore throat, sores in mouth.  Denies difficulty swallowing Eyes: Denies blurry vision, pain in eye, drainage.  Denies discoloration of eyes. Neck: Denies pain.  Denies swelling.  Denies pain with movement. Cardiovascular: Denies chest pain, palpitations.  Denies edema.  Denies orthopnea Respiratory: Denies shortness of breath, cough.  Denies wheezing.  Denies sputum production Gastrointestinal: Denies abdominal pain, swelling.  Denies nausea, vomiting, diarrhea.  Denies melena.  Denies hematemesis. Musculoskeletal: Denies limitation of movement.  Denies deformity or swelling.  Denies pain.  Denies arthralgias or myalgias. Genitourinary: Denies pelvic pain.  Denies urinary frequency or hesitancy.  Denies dysuria.  Skin: Denies rash.  Denies petechiae, purpura, ecchymosis. Neurological: Denies headache.  Denies syncope.  Denies seizure activity.  Denies weakness or paresthesia.  Denies slurred speech, drooping face.  Denies visual change. Psychiatric: Denies depression, anxiety.  Denies hallucinations.  Denies suicidal thoughts  Past Medical History:  Diagnosis Date   Hypertension     History reviewed. No pertinent surgical history.  Social History  reports that he has quit smoking. He has never used smokeless tobacco. He reports that he does not currently use alcohol. He reports that he does not currently use drugs after having used the following drugs: "Crack" cocaine and Heroin.  No Known Allergies  History reviewed. No pertinent family history.   Prior to Admission medications   Medication Sig Start Date  End Date Taking? Authorizing Provider  lisinopril (ZESTRIL) 30 MG tablet Take 1 tablet (30 mg total) by mouth daily. For high blood pressure 07/27/20   Armandina Stammer I, NP  nicotine polacrilex (NICORETTE) 2 MG gum Take 1 each (2 mg total) by mouth as needed. (May buy from over the counter): For smoking cessation  07/27/20   Armandina Stammer I, NP  traZODone (DESYREL) 50 MG tablet Take 1 tablet (50 mg total) by mouth at bedtime as needed for sleep. 07/27/20   Armandina Stammer I, NP  Vitamins-Lipotropics (LIPOFLAVONOID) TABS Take 2 tablets by mouth 3 (three) times daily. (Patient's own medication): For vitamin supplementation 07/27/20   Armandina Stammer I, NP    Physical Exam: Vitals:   01/12/21 2100 01/12/21 2130 01/12/21 2230 01/12/21 2300  BP: (!) 172/114 (!) 179/100 (!) 180/81 130/71  Pulse: 66 72 66 66  Resp: 10 10 12 11   Temp:      TempSrc:      SpO2: 100% 98% 100% 98%  Weight:      Height:        Constitutional: NAD, calm, comfortable Vitals:   01/12/21 2100 01/12/21 2130 01/12/21 2230 01/12/21 2300  BP: (!) 172/114 (!) 179/100 (!) 180/81 130/71  Pulse: 66 72 66 66  Resp: 10 10 12 11   Temp:      TempSrc:      SpO2: 100% 98% 100% 98%  Weight:      Height:       General: WDWN, Alert and oriented x3.  Eyes: EOMI, PERRL, conjunctivae normal.  Sclera nonicteric HENT:  [place]/AT, external ears normal.  Nares patent without epistasis.  Mucous membranes are moist. Posterior pharynx clear of any exudate or lesions. Normal dentition.  Neck: Soft, normal range of motion, supple, no masses, Trachea midline Respiratory: clear to auscultation bilaterally, no wheezing, no crackles. Normal respiratory effort. No accessory muscle use.  Cardiovascular: Regular rate and rhythm, no murmurs / rubs / gallops. No extremity edema. 2+ pedal pulses. Abdomen: Soft, no tenderness, nondistended, no rebound or guarding.  No masses palpated. Bowel sounds normoactive Musculoskeletal: FROM. no cyanosis. No joint deformity upper and lower extremities. Normal muscle tone.  Skin: Warm, dry, intact no rashes, lesions, ulcers. No induration Neurologic: CN 2-12 grossly intact.  Normal speech.  Sensation intact to touch. Strength 5/5 in all extremities.   Psychiatric: Normal judgment and insight.  Normal mood.    Labs on Admission: I  have personally reviewed following labs and imaging studies  CBC: Recent Labs  Lab 01/12/21 2048  WBC 9.4  NEUTROABS 7.9*  HGB 16.1  HCT 48.9  MCV 91.9  PLT 257    Basic Metabolic Panel: Recent Labs  Lab 01/12/21 2048  NA 139  K 4.6  CL 101  CO2 29  GLUCOSE 109*  BUN 16  CREATININE 1.43*  CALCIUM 9.0    GFR: Estimated Creatinine Clearance: 84.9 mL/min (A) (by C-G formula based on SCr of 1.43 mg/dL (H)).  Liver Function Tests: Recent Labs  Lab 01/12/21 2048  AST 32  ALT 24  ALKPHOS 53  BILITOT 0.7  PROT 7.2  ALBUMIN 4.6    Urine analysis:    Component Value Date/Time   COLORURINE YELLOW 07/01/2020 0930   APPEARANCEUR CLEAR 07/01/2020 0930   LABSPEC 1.009 07/01/2020 0930   PHURINE 7.0 07/01/2020 0930   GLUCOSEU NEGATIVE 07/01/2020 0930   HGBUR NEGATIVE 07/01/2020 0930   BILIRUBINUR NEGATIVE 07/01/2020 0930   BILIRUBINUR negative 03/21/2020 1101   KETONESUR  NEGATIVE 07/01/2020 0930   PROTEINUR NEGATIVE 07/01/2020 0930   UROBILINOGEN 0.2 03/21/2020 1101   NITRITE NEGATIVE 07/01/2020 0930   LEUKOCYTESUR NEGATIVE 07/01/2020 0930    Radiological Exams on Admission: DG Chest Port 1 View  Result Date: 01/12/2021 CLINICAL DATA:  Shortness of breath. EXAM: PORTABLE CHEST 1 VIEW COMPARISON:  None. FINDINGS: The heart size and mediastinal contours are within normal limits. Both lungs are clear. The visualized skeletal structures are unremarkable. IMPRESSION: No active disease. Electronically Signed   By: Elgie Collard M.D.   On: 01/12/2021 22:19    EKG: Independently reviewed.  EKG shows normal sinus rhythm with no acute ST elevation or depression.  QTc 418  Assessment/Plan Principal Problem:   Acute respiratory failure with hypoxia  Mr. Paiz admitted to ICU on Narcan infusion. Continuous pulse oximetry ordered. Pt on high flow nasal cannula now and maintaining O2 sat. Will work to wean oxygen as tolerated. Narcan will be weaned in am.  Patient with  probable fentanyl overdose from recent drug use.  Active Problems:   Hypertension Blood pressure was significant elevated when he first arrived in the emergency room blood pressure was initially very elevated in the emergency room ranging from 155-203/81-104. Is now 130/75. Pt has used lisinopril in past but hasn't had any in past few months due to not having insurance.  We will monitor blood pressure.  If blood pressure remains elevated can be discharged with metoprolol as it is on the $4 list at most pharmacies so would not be an overburden financially for him.    AKI (acute kidney injury) Was likely secondary to overdose.  IV fluid with LR at 125 mL/hr Recheck electrolytes and renal function in am    Polysubstance overdose Consult case management to assist with patient drug rehab programs for patient     Tobacco use Patient reports he is now vaping daily.  Will provide nicotine patch to prevent withdrawals.  Patient advised to quit use of tobacco and vaping products for overall health    DVT prophylaxis: Pad[place] score low. Early ambulation and TED hose for DVT prophylaxis.  Code Status:   Full code  Family Communication:  Diagnosis and plan discussed with patient and his wife who is at bedside.  They both verbalized understanding agree plan.  Further recommendations to follow as clinically indicated Disposition Plan:   Patient is from:  Home  Anticipated DC to:  Home  Anticipated DC date:  Anticipate 2 midnight stay  Admission status:  Inpatient   Claudean Severance Allysha Tryon MD [facility]  How to contact the [facility] Attending or Consulting provider 7A - 7P or covering provider during after hours 7P -7A, for this patient?   Check the care team in [facility] and look for a) attending/consulting [facility] provider listed and b) the [facility] team listed Log into [email or web address] and use [place]'s universal password to access. If you do not have the password, please contact the hospital operator. Locate  the [facility] provider you are looking for under [facility] and page to a number that you can be directly reached. If you still have difficulty reaching the provider, please page the [facility] (Director on Call) for the Hospitalists listed on amion for assistance.  01/12/2021, 11:41 PM

## 2021-01-12 NOTE — ED Triage Notes (Signed)
BIBA Pt overdose on fentanyl and cocaine- snorted.  2 of narcan nasally and 2 IV. Pt A&Ox4. Vitals WDL

## 2021-01-13 DIAGNOSIS — T50904A Poisoning by unspecified drugs, medicaments and biological substances, undetermined, initial encounter: Secondary | ICD-10-CM

## 2021-01-13 DIAGNOSIS — I10 Essential (primary) hypertension: Secondary | ICD-10-CM

## 2021-01-13 DIAGNOSIS — N179 Acute kidney failure, unspecified: Secondary | ICD-10-CM

## 2021-01-13 LAB — CBC
HCT: 42.4 % (ref 39.0–52.0)
Hemoglobin: 14.4 g/dL (ref 13.0–17.0)
MCH: 30.4 pg (ref 26.0–34.0)
MCHC: 34 g/dL (ref 30.0–36.0)
MCV: 89.6 fL (ref 80.0–100.0)
Platelets: 240 10*3/uL (ref 150–400)
RBC: 4.73 MIL/uL (ref 4.22–5.81)
RDW: 13.1 % (ref 11.5–15.5)
WBC: 7.2 10*3/uL (ref 4.0–10.5)
nRBC: 0 % (ref 0.0–0.2)

## 2021-01-13 LAB — BASIC METABOLIC PANEL
Anion gap: 6 (ref 5–15)
BUN: 16 mg/dL (ref 6–20)
CO2: 32 mmol/L (ref 22–32)
Calcium: 9 mg/dL (ref 8.9–10.3)
Chloride: 101 mmol/L (ref 98–111)
Creatinine, Ser: 1.15 mg/dL (ref 0.61–1.24)
GFR, Estimated: >60 mL/min (ref >60)
Glucose, Bld: 113 mg/dL — ABNORMAL HIGH (ref 70–99)
Potassium: 4.1 mmol/L (ref 3.5–5.1)
Sodium: 139 mmol/L (ref 135–145)

## 2021-01-13 LAB — RESP PANEL BY RT-PCR (FLU A&B, COVID) ARPGX2
Influenza A by PCR: NEGATIVE
Influenza B by PCR: NEGATIVE
SARS Coronavirus 2 by RT PCR: NEGATIVE

## 2021-01-13 LAB — MRSA NEXT GEN BY PCR, NASAL: MRSA by PCR Next Gen: NOT DETECTED

## 2021-01-13 MED ORDER — CALCIUM CARBONATE ANTACID 500 MG PO CHEW
1.0000 | CHEWABLE_TABLET | Freq: Once | ORAL | Status: AC
  Administered 2021-01-13: 200 mg via ORAL

## 2021-01-13 MED ORDER — ORAL CARE MOUTH RINSE
15.0000 mL | Freq: Two times a day (BID) | OROMUCOSAL | Status: DC
  Administered 2021-01-13 (×3): 15 mL via OROMUCOSAL

## 2021-01-13 MED ORDER — CALCIUM CARBONATE ANTACID 500 MG PO CHEW
1.0000 | CHEWABLE_TABLET | Freq: Two times a day (BID) | ORAL | Status: DC
  Administered 2021-01-13 (×2): 200 mg via ORAL
  Filled 2021-01-13 (×3): qty 1

## 2021-01-13 MED ORDER — DOCUSATE SODIUM 100 MG PO CAPS
100.0000 mg | ORAL_CAPSULE | Freq: Every day | ORAL | Status: DC | PRN
  Administered 2021-01-13: 100 mg via ORAL
  Filled 2021-01-13: qty 1

## 2021-01-13 MED ORDER — CHLORHEXIDINE GLUCONATE CLOTH 2 % EX PADS
6.0000 | MEDICATED_PAD | Freq: Every day | CUTANEOUS | Status: DC
  Administered 2021-01-13: 6 via TOPICAL

## 2021-01-13 MED ORDER — HYDRALAZINE HCL 20 MG/ML IJ SOLN
10.0000 mg | Freq: Four times a day (QID) | INTRAMUSCULAR | Status: DC | PRN
  Administered 2021-01-13 – 2021-01-14 (×4): 10 mg via INTRAVENOUS
  Filled 2021-01-13 (×4): qty 1

## 2021-01-13 NOTE — Progress Notes (Signed)
PROGRESS NOTE    Erik Campbell  TKZ:[id number] DOB: [birth year] DOA: 01/12/2021 PCP: Jackie Plum, MD   Brief Narrative:  The patient is a 40 year old obese Caucasian male with a past medical history significant for but not limited to hypertension, polysubstance abuse as well as other comorbidities who presented to the hospital via EMS with accidental overdose with respiratory depression.  He reported that he about some cocaine and what he thought was to be heroin and snorted it.  He does not remember what happened after that.  Someone he was with had called EMS and when they arrived he had respiratory depression and low oxygen saturation is given Narcan intranasally and then via the IV.  He became alert and oriented after the arrival to emergency room and he was being monitored.  Subsequently became lethargic again had periods of apnea and oxygen desaturations so given more Narcan and eventually placed on Narcan infusion.  He reports that he has a history of polysubstance abuse but he quit back in March of this year after he had an intentional overdose.  He reports that he had a lot of stress in his life recently so he decided to use some cocaine and drugs.  He denies any depression or suicidal ideation.  He states that he does go to [facility] but is currently not followed by mental health.  He was admitted with elevated blood pressures and respiratory depression and was placed on Narcan drip and admitted to the ICU.  He was alert and oriented without complaints however this morning he was very drowsy and somnolent and nursing believes that he may have done more drugs in the bathroom.  On presentation he had an AKI with a creatinine of 1.43 and UDS is positive for cocaine.  COVID testing was negative.  Assessment & Plan:   Principal Problem:   Acute respiratory failure with hypoxia (HCC) Active Problems:   Hypertension   Polysubstance overdose   AKI (acute kidney injury) (HCC)  Acute  respiratory failure with hypoxia -He was admitted to the ICU on a Narcan infusion -Continuous pulse oximetry maintain O2 saturations greater than 90% -SpO2: 92 % O2 Flow Rate (L/min): 2 L/min -Continue supplemental oxygen via nasal cannula and wean O2 as tolerated -Continue to wean Narcan drip -Patient with probable fentanyl from recent drug use -Fentanyl lab has been sent out -Nursing reports that he had done some more blood while he was in the bathroom because he went to the bathroom.  Times a day became more somnolent.  We will continue the Narcan drip for now continue monitor respiratory status very carefully  Hypertension -Blood pressure was significantly elevated when he diverted to the emergency room -Uses lisinopril but has not had it in the last few months due to not having insurance -Avoid beta-blockers given his positive cocaine use -Continue monitor blood pressures per protocol -Continue with hydralazine 10 mg Q6 as needed for SBP>180 or DBP>100 -Last blood pressure reading was 184/104  AKI -Presented with a BUNs/creatinine of 16/1.43 is now 16/1.15 -Continue with IV fluid hydration with LR at 125 MLS per hour -Avoid nephrotoxic meds, contrast dyes, hypotension and renally dose medications -Continue monitor and trend renal function repeat CMP in a.m.  Polysubstance abuse -Was positive for cocaine -Fentanyl and MTB confirmation is pending  Tobacco Abuse -Smoking cessation counseling given -Continue with nicotine patch  Obesity -Complicates overall prognosis and care -Estimated body mass index is 31.19 kg/m as calculated from the following:   Height  as of this encounter: 6' (1.829 m).   Weight as of this encounter: 104.3 kg. -Weight Loss and Dietary Counseling given   DVT prophylaxis: SCDs Code Status: FULL CODE  Family Communication: No family present at bedside Disposition Plan: Pending further clinical improvement  Status is: Inpatient  Remains inpatient  appropriate because:Unsafe d/c plan, IV treatments appropriate due to intensity of illness or inability to take PO, and Inpatient level of care appropriate due to severity of illness  Dispo: The patient is from: Home              Anticipated d/c is to:  TBD              Patient currently is not medically stable to d/c.   Difficult to place patient No  Consultants:  None  Procedures: None  Antimicrobials:  Anti-infectives (From admission, onward)    None        Subjective: Seen and examined at bedside and he was a little confused and had pinpoint pupils and acting somnolent.  On naloxone drip.  Nursing thinks that he may have done some more drugs in the bathroom.  We will need to monitor carefully.  We will continue IV fluid hydration and naloxone drip  Objective: Vitals:   01/13/21 1151 01/13/21 1223 01/13/21 1415 01/13/21 1500  BP: (!) 171/77  (!) 184/104   Pulse:      Resp: 10  13 11   Temp:  98.5 F (36.9 C)    TempSrc:  Oral    SpO2: 95%  92%   Weight:      Height:        Intake/Output Summary (Last 24 hours) at 01/13/2021 1610 Last data filed at 01/13/2021 1500 Gross per 24 hour  Intake 2574.82 ml  Output 3 ml  Net 2571.82 ml   Filed Weights   01/12/21 1839  Weight: 104.3 kg   Examination: Physical Exam:  Constitutional: WN/WD obese Caucasian male in NAD and appears calm and comfortable Eyes: Lids and conjunctivae normal, sclerae anicteric  ENMT: External Ears, Nose appear normal. Grossly normal hearing. Mucous membranes are moist.  Neck: Appears normal, supple, no cervical masses, normal ROM, no appreciable thyromegaly; no JVD Respiratory: Diminished to auscultation bilaterally, no wheezing, rales, rhonchi or crackles. Normal respiratory effort and patient is not tachypenic. No accessory muscle use. Unlabored breathing but wearing Supplemental O2  Cardiovascular: RRR, no murmurs / rubs / gallops. S1 and S2 auscultated. No extremity edema.  Abdomen: Soft,  non-tender, Distended 2/2 body habitus. Bowel sounds positive.  GU: Deferred. Musculoskeletal: No clubbing / cyanosis of digits/nails. No joint deformity upper and lower extremities.  Skin: No rashes, lesions, ulcers on a limited skin evaluation. No induration; Warm and dry.  Neurologic: CN 2-12 grossly intact with no focal deficits. Romberg sign and cerebellar reflexes not assessed.  Psychiatric: Normal judgment and insight. Alert and oriented x 3. Normal mood and appropriate affect.   Data Reviewed: I have personally reviewed following labs and imaging studies  CBC: Recent Labs  Lab 01/12/21 2048 01/13/21 0307  WBC 9.4 7.2  NEUTROABS 7.9*  --   HGB 16.1 14.4  HCT 48.9 42.4  MCV 91.9 89.6  PLT 257 240   Basic Metabolic Panel: Recent Labs  Lab 01/12/21 2048 01/13/21 0307  NA 139 139  K 4.6 4.1  CL 101 101  CO2 29 32  GLUCOSE 109* 113*  BUN 16 16  CREATININE 1.43* 1.15  CALCIUM 9.0 9.0   GFR: Estimated  Creatinine Clearance: 105.6 mL/min (by C-G formula based on SCr of 1.15 mg/dL). Liver Function Tests: Recent Labs  Lab 01/12/21 2048  AST 32  ALT 24  ALKPHOS 53  BILITOT 0.7  PROT 7.2  ALBUMIN 4.6   No results for input(s): LIPASE, AMYLASE in the last 168 hours. No results for input(s): AMMONIA in the last 168 hours. Coagulation Profile: No results for input(s): INR, PROTIME in the last 168 hours. Cardiac Enzymes: No results for input(s): CKTOTAL, CKMB, CKMBINDEX, TROPONINI in the last 168 hours. BNP (last 3 results) No results for input(s): PROBNP in the last 8760 hours. HbA1C: No results for input(s): HGBA1C in the last 72 hours. CBG: Recent Labs  Lab 01/12/21 2050  GLUCAP 121*   Lipid Profile: No results for input(s): CHOL, HDL, LDLCALC, TRIG, CHOLHDL, LDLDIRECT in the last 72 hours. Thyroid Function Tests: No results for input(s): TSH, T4TOTAL, FREET4, T3FREE, THYROIDAB in the last 72 hours. Anemia Panel: No results for input(s): VITAMINB12,  FOLATE, FERRITIN, TIBC, IRON, RETICCTPCT in the last 72 hours. Sepsis Labs: No results for input(s): PROCALCITON, LATICACIDVEN in the last 168 hours.  Recent Results (from the past 240 hour(s))  Resp Panel by RT-PCR (Flu A&B, Covid) Nasopharyngeal Swab     Status: None   Collection Time: 01/12/21  8:45 PM   Specimen: Nasopharyngeal Swab; Nasopharyngeal(NP) swabs in vial transport medium  Result Value Ref Range Status   SARS Coronavirus 2 by RT PCR NEGATIVE NEGATIVE Final    Comment: (NOTE) SARS-CoV-2 target nucleic acids are NOT DETECTED.  The SARS-CoV-2 RNA is generally detectable in upper respiratory specimens during the acute phase of infection. The lowest concentration of SARS-CoV-2 viral copies this assay can detect is 138 copies/mL. A negative result does not preclude SARS-Cov-2 infection and should not be used as the sole basis for treatment or other patient management decisions. A negative result may occur with  improper specimen collection/handling, submission of specimen other than nasopharyngeal swab, presence of viral mutation(s) within the areas targeted by this assay, and inadequate number of viral copies(<138 copies/mL). A negative result must be combined with clinical observations, patient history, and epidemiological information. The expected result is Negative.  Fact Sheet for Patients:  BloggerCourse.com  Fact Sheet for Healthcare Providers:  SeriousBroker.it  This test is no t yet approved or cleared by the [place] FDA and  has been authorized for detection and/or diagnosis of SARS-CoV-2 by FDA under an Emergency Use Authorization (EUA). This EUA will remain  in effect (meaning this test can be used) for the duration of the COVID-19 declaration under Section 564(b)(1) of the Act, 21 U.S.C.section 360bbb-3(b)(1), unless the authorization is terminated  or revoked sooner.       Influenza A by PCR  NEGATIVE NEGATIVE Final   Influenza B by PCR NEGATIVE NEGATIVE Final    Comment: (NOTE) The Xpert Xpress SARS-CoV-2/FLU/RSV plus assay is intended as an aid in the diagnosis of influenza from Nasopharyngeal swab specimens and should not be used as a sole basis for treatment. Nasal washings and aspirates are unacceptable for Xpert Xpress SARS-CoV-2/FLU/RSV testing.  Fact Sheet for Patients: BloggerCourse.com  Fact Sheet for Healthcare Providers: SeriousBroker.it  This test is not yet approved or cleared by the [place] FDA and has been authorized for detection and/or diagnosis of SARS-CoV-2 by FDA under an Emergency Use Authorization (EUA). This EUA will remain in effect (meaning this test can be used) for the duration of the COVID-19 declaration under Section 564(b)(1) of the  Act, 21 U.S.C. section 360bbb-3(b)(1), unless the authorization is terminated or revoked.  Performed at [facility], [address], [place], [place] [address]   MRSA Next Gen by PCR, Nasal     Status: None   Collection Time: 01/13/21 12:53 AM   Specimen: Nasal Mucosa; Nasal Swab  Result Value Ref Range Status   MRSA by PCR Next Gen NOT DETECTED NOT DETECTED Final    Comment: (NOTE) The GeneXpert MRSA Assay (FDA approved for NASAL specimens only), is one component of a comprehensive MRSA colonization surveillance program. It is not intended to diagnose MRSA infection nor to guide or monitor treatment for MRSA infections. Test performance is not FDA approved in patients less than 66 years old. Performed at [facility], [address], [place], [place] [address]     RN Pressure Injury Documentation:     Estimated body mass index is 31.19 kg/m as calculated from the following:   Height as of this encounter: 6' (1.829 m).   Weight as of this encounter: 104.3 kg.  Malnutrition Type:   Malnutrition  Characteristics:   Nutrition Interventions:     Radiology Studies: DG Chest Port 1 View  Result Date: 01/12/2021 CLINICAL DATA:  Shortness of breath. EXAM: PORTABLE CHEST 1 VIEW COMPARISON:  None. FINDINGS: The heart size and mediastinal contours are within normal limits. Both lungs are clear. The visualized skeletal structures are unremarkable. IMPRESSION: No active disease. Electronically Signed   By: Elgie Collard M.D.   On: 01/12/2021 22:19    Scheduled Meds:  calcium carbonate  1 tablet Oral BID   Chlorhexidine Gluconate Cloth  6 each Topical Daily   mouth rinse  15 mL Mouth Rinse BID   nicotine  14 mg Transdermal Daily   Continuous Infusions:  lactated ringers 125 mL/hr at 01/13/21 1553   naLOXone [facility]) adult infusion for OVERDOSE 0.25 mg/hr (01/13/21 1500)    LOS: 1 day   Merlene Laughter, DO [facility] PAGER is on AMION  If 7PM-7AM, please contact night-coverage [email or web address]

## 2021-01-13 NOTE — Progress Notes (Addendum)
Pt had a orange & black vape in pt belongings.   Vape was placed in a biohazard bag w/ pt label in pt chart.   Witnessed by Mardi Mainland, RN and myself  Vape has been given to sig. other Antony Salmon) @ 2am. Pt charger, body wash, and a few clothes received to give to pt

## 2021-01-13 NOTE — Plan of Care (Signed)
Discussed with patient plan of care for the shift, pain management and admission questions/procedures with some teach back displayed  Problem: Education: Goal: Knowledge of General Education information will improve Description: Including pain rating scale, medication(s)/side effects and non-pharmacologic comfort measures Outcome: Progressing

## 2021-01-14 DIAGNOSIS — T50901A Poisoning by unspecified drugs, medicaments and biological substances, accidental (unintentional), initial encounter: Secondary | ICD-10-CM

## 2021-01-14 LAB — CBC WITH DIFFERENTIAL/PLATELET
Abs Immature Granulocytes: 0.02 10*3/uL (ref 0.00–0.07)
Basophils Absolute: 0 10*3/uL (ref 0.0–0.1)
Basophils Relative: 1 %
Eosinophils Absolute: 0.2 10*3/uL (ref 0.0–0.5)
Eosinophils Relative: 3 %
HCT: 45.9 % (ref 39.0–52.0)
Hemoglobin: 15.7 g/dL (ref 13.0–17.0)
Immature Granulocytes: 0 %
Lymphocytes Relative: 23 %
Lymphs Abs: 1.7 10*3/uL (ref 0.7–4.0)
MCH: 30.4 pg (ref 26.0–34.0)
MCHC: 34.2 g/dL (ref 30.0–36.0)
MCV: 89 fL (ref 80.0–100.0)
Monocytes Absolute: 0.8 10*3/uL (ref 0.1–1.0)
Monocytes Relative: 11 %
Neutro Abs: 4.6 10*3/uL (ref 1.7–7.7)
Neutrophils Relative %: 62 %
Platelets: 224 10*3/uL (ref 150–400)
RBC: 5.16 MIL/uL (ref 4.22–5.81)
RDW: 12.9 % (ref 11.5–15.5)
WBC: 7.4 10*3/uL (ref 4.0–10.5)
nRBC: 0 % (ref 0.0–0.2)

## 2021-01-14 LAB — COMPREHENSIVE METABOLIC PANEL
ALT: 20 U/L (ref 0–44)
AST: 19 U/L (ref 15–41)
Albumin: 3.7 g/dL (ref 3.5–5.0)
Alkaline Phosphatase: 43 U/L (ref 38–126)
Anion gap: 7 (ref 5–15)
BUN: 13 mg/dL (ref 6–20)
CO2: 29 mmol/L (ref 22–32)
Calcium: 9 mg/dL (ref 8.9–10.3)
Chloride: 102 mmol/L (ref 98–111)
Creatinine, Ser: 1.06 mg/dL (ref 0.61–1.24)
GFR, Estimated: >60 mL/min (ref >60)
Glucose, Bld: 94 mg/dL (ref 70–99)
Potassium: 3.6 mmol/L (ref 3.5–5.1)
Sodium: 138 mmol/L (ref 135–145)
Total Bilirubin: 0.8 mg/dL (ref 0.3–1.2)
Total Protein: 6.3 g/dL — ABNORMAL LOW (ref 6.5–8.1)

## 2021-01-14 LAB — MAGNESIUM: Magnesium: 1.9 mg/dL (ref 1.7–2.4)

## 2021-01-14 LAB — PHOSPHORUS: Phosphorus: 2.1 mg/dL — ABNORMAL LOW (ref 2.5–4.6)

## 2021-01-14 MED ORDER — AMLODIPINE BESYLATE 10 MG PO TABS: 10.0000 mg | ORAL_TABLET | Freq: Every day | ORAL | 0 refills | Status: DC

## 2021-01-14 MED ORDER — LISINOPRIL 20 MG PO TABS: 20.0000 mg | ORAL_TABLET | Freq: Every day | ORAL | 0 refills | Status: AC

## 2021-01-14 MED ORDER — AMLODIPINE BESYLATE 10 MG PO TABS: 10.0000 mg | ORAL_TABLET | Freq: Every day | ORAL | Status: DC

## 2021-01-14 MED ORDER — NICOTINE 14 MG/24HR TD PT24: 14.0000 mg | MEDICATED_PATCH | Freq: Every day | TRANSDERMAL | 0 refills | Status: DC

## 2021-01-14 MED ORDER — K PHOS MONO-SOD PHOS DI & MONO 155-852-130 MG PO TABS
500.0000 mg | ORAL_TABLET | Freq: Two times a day (BID) | ORAL | Status: DC
Start: 2021-01-14 — End: 2021-01-14
  Administered 2021-01-14: 500 mg via ORAL
  Filled 2021-01-14: qty 2

## 2021-01-14 MED ORDER — LISINOPRIL 10 MG PO TABS
20.0000 mg | ORAL_TABLET | Freq: Every day | ORAL | Status: DC
  Administered 2021-01-14: 20 mg via ORAL
  Filled 2021-01-14: qty 2

## 2021-01-14 NOTE — Progress Notes (Signed)
Gave patient his discharge instructions. Patient verbalized understanding. Took patient off of the monitor and patient removed his IV. Returned patients belongings. Walked patient out of hospital @ 1300 where is girlfriend picked him up.

## 2021-01-14 NOTE — Plan of Care (Signed)

## 2021-01-14 NOTE — Discharge Summary (Signed)
Physician Discharge Summary  Erik Campbell EXB:[id number] DOB: Apr 18, 1981 DOA: 01/12/2021  PCP: Jackie Plum, MD  Admit date: 01/12/2021 Discharge date: 01/14/2021  Admitted From: Home Disposition: Home  Recommendations for Outpatient Follow-up:  Follow up with PCP in 1-2 weeks Avoid illicit substances Please obtain CMP/CBC, Mag, Phos in one week Please follow up on the following pending results:  Home Health: No Equipment/Devices: None    Discharge Condition: Stable CODE STATUS: FULL CODE  Diet recommendation: Heart Healthy Diet   Brief/Interim Summary: The patient is a 40 year old obese Caucasian male with a past medical history significant for but not limited to hypertension, polysubstance abuse as well as other comorbidities who presented to the hospital via EMS with accidental overdose with respiratory depression.  He reported that he about some cocaine and what he thought was to be heroin and snorted it.  He does not remember what happened after that.  Someone he was with had called EMS and when they arrived he had respiratory depression and low oxygen saturation is given Narcan intranasally and then via the IV.  He became alert and oriented after the arrival to emergency room and he was being monitored.  Subsequently became lethargic again had periods of apnea and oxygen desaturations so given more Narcan and eventually placed on Narcan infusion.  He reports that he has a history of polysubstance abuse but he quit back in March of this year after he had an intentional overdose.  He reports that he had a lot of stress in his life recently so he decided to use some cocaine and drugs.  He denies any depression or suicidal ideation.  He states that he does go to [facility] but is currently not followed by mental health.  He was admitted with elevated blood pressures and respiratory depression and was placed on Narcan drip and admitted to the ICU.  He was alert and oriented without  complaints however this morning he was very drowsy and somnolent and nursing believes that he may have done more drugs in the bathroom.  On presentation he had an AKI with a creatinine of 1.43 and UDS is positive for cocaine.  COVID testing was negative.  His AKI resolved and he was weaned off of Narcan drip and remained stable and was back to his baseline mentation and did not have any depressed respirations.  He ambulated around the unit without issues.  Blood pressure was a little elevated but he was treated with IV hydralazine and added amlodipine for discharge.  He is stable to be discharged at this time and we will follow-up with PCP and avoid illicit substances.  Discharge Diagnoses:  Principal Problem:   Acute respiratory failure with hypoxia (HCC) Active Problems:   Hypertension   Polysubstance overdose   AKI (acute kidney injury) (HCC)    Acute respiratory failure with hypoxia, improved  -He was admitted to the ICU on a Narcan infusion -Continuous pulse oximetry maintain O2 saturations greater than 90% -SpO2: 92 % O2 Flow Rate (L/min): 2 L/min; Weanned off of O2  -Continue supplemental oxygen via nasal cannula and wean O2 as tolerated -Continued to wean Narcan drip and stopped and he did well  -Patient with probable fentanyl OD from recent drug use and this was accidental outpatient and no SI or HI and did not have any feelings to hurt himself. -Fentanyl lab has been sent out and pending  -Nursing reports that he had done some more drugs while he was in the bathroom because  he went to the bathroom 3 Times yesterday morning became more somnolent.   -He is more alert now and discontinued the Narcan drip and he did well after his just continue so he will be discharged home and he was advised about the complications of drug use understands agrees with the plan of care   Hypertension -Blood pressure was significantly elevated when he diverted to the emergency room -Uses lisinopril but  has not had it in the last few months due to not having insurance -Avoid beta-blockers given his positive cocaine use -Continue monitor blood pressures per protocol -Added back Lisinopril 20 mg po Daily and Amlodipine 10 mg po Daily  -Continue with hydralazine 10 mg Q6 as needed for SBP>180 or DBP>100 -Last blood pressure reading was 171/81 at the time of D/C -Follow up with PCP for further blood pressure management   Hypophosphatemia -Pateint's Phos Level was 2.1 -Replete with po K Phos Neutral 500 mg BID x2 Doses -Continue to Monitor and Replete as Necessary -Repeat Phos Level in the AM    AKI -Presented with a BUNs/creatinine of 16/1.43 and improved to 16/1.15 -> 13/1.06 -Continue with IV fluid hydration with LR at 125 MLS per hour but will now stop -Avoid nephrotoxic meds, contrast dyes, hypotension and renally dose medications -Continue monitor and trend renal function repeat CMP within 1 week    Polysubstance abuse -Was positive for cocaine -Fentanyl and MTB confirmation is pending -Counseling given   Tobacco Abuse -Smoking cessation counseling given -Continue with nicotine patch at discharge   Obesity -Complicates overall prognosis and care -Estimated body mass index is 31.19 kg/m as calculated from the following:   Height as of this encounter: 6' (1.829 m).   Weight as of this encounter: 104.3 kg. -Weight Loss and Dietary Counseling given    Discharge Instructions  Discharge Instructions     Call MD for:  difficulty breathing, headache or visual disturbances   Complete by: As directed    Call MD for:  difficulty breathing, headache or visual disturbances   Complete by: As directed    Call MD for:  extreme fatigue   Complete by: As directed    Call MD for:  extreme fatigue   Complete by: As directed    Call MD for:  hives   Complete by: As directed    Call MD for:  hives   Complete by: As directed    Call MD for:  persistant dizziness or light-headedness    Complete by: As directed    Call MD for:  persistant dizziness or light-headedness   Complete by: As directed    Call MD for:  persistant nausea and vomiting   Complete by: As directed    Call MD for:  persistant nausea and vomiting   Complete by: As directed    Call MD for:  redness, tenderness, or signs of infection (pain, swelling, redness, odor or green/yellow discharge around incision site)   Complete by: As directed    Call MD for:  redness, tenderness, or signs of infection (pain, swelling, redness, odor or green/yellow discharge around incision site)   Complete by: As directed    Call MD for:  severe uncontrolled pain   Complete by: As directed    Call MD for:  severe uncontrolled pain   Complete by: As directed    Call MD for:  temperature >100.4   Complete by: As directed    Call MD for:  temperature >100.4   Complete by:  As directed    Diet - low sodium heart healthy   Complete by: As directed    Diet - low sodium heart healthy   Complete by: As directed    Discharge instructions   Complete by: As directed    You were cared for by a hospitalist during your hospital stay. If you have any questions about your discharge medications or the care you received while you were in the hospital after you are discharged, you can call the unit and ask to speak with the hospitalist on call if the hospitalist that took care of you is not available. Once you are discharged, your primary care physician will handle any further medical issues. Please note that NO REFILLS for any discharge medications will be authorized once you are discharged, as it is imperative that you return to your primary care physician (or establish a relationship with a primary care physician if you do not have one) for your aftercare needs so that they can reassess your need for medications and monitor your lab values.  Follow up with PCP and Psychiatry in the outpatient setting. Take all medications as prescribed. If  symptoms change or worsen please return to the ED for evaluation   Discharge instructions   Complete by: As directed    You were cared for by a hospitalist during your hospital stay. If you have any questions about your discharge medications or the care you received while you were in the hospital after you are discharged, you can call the unit and ask to speak with the hospitalist on call if the hospitalist that took care of you is not available. Once you are discharged, your primary care physician will handle any further medical issues. Please note that NO REFILLS for any discharge medications will be authorized once you are discharged, as it is imperative that you return to your primary care physician (or establish a relationship with a primary care physician if you do not have one) for your aftercare needs so that they can reassess your need for medications and monitor your lab values.  Follow up with PCP and Psychiatry and avoid illicit substances and drugs. Take all medications as prescribed. If symptoms change or worsen please return to the ED for evaluation   Increase activity slowly   Complete by: As directed    Increase activity slowly   Complete by: As directed       Allergies as of 01/14/2021   No Known Allergies      Medication List     STOP taking these medications    nicotine polacrilex 2 MG gum Commonly known as: NICORETTE   traZODone 50 MG tablet Commonly known as: DESYREL       TAKE these medications    amLODipine 10 MG tablet Commonly known as: NORVASC Take 1 tablet (10 mg total) by mouth daily.   Lipoflavonoid Tabs Take 2 tablets by mouth 3 (three) times daily. (Patient's own medication): For vitamin supplementation   lisinopril 20 MG tablet Commonly known as: ZESTRIL Take 1 tablet (20 mg total) by mouth daily. Start taking on: January 15, 2021 What changed:  medication strength how much to take additional instructions   nicotine 14 mg/24hr  patch Commonly known as: NICODERM CQ - dosed in mg/24 hours Place 1 patch (14 mg total) onto the skin daily. Start taking on: January 15, 2021        Follow-up Information     Osei-Bonsu, Greggory Stallion, MD. Call.   Specialty:  Internal Medicine Why: Call within 1 week for follow up Contact information: [address] [place] [address] [phone]                No Known Allergies  Consultations: None  Procedures/Studies: DG Chest Port 1 View  Result Date: 01/12/2021 CLINICAL DATA:  Shortness of breath. EXAM: PORTABLE CHEST 1 VIEW COMPARISON:  None. FINDINGS: The heart size and mediastinal contours are within normal limits. Both lungs are clear. The visualized skeletal structures are unremarkable. IMPRESSION: No active disease. Electronically Signed   By: Elgie Collard M.D.   On: 01/12/2021 22:19    Subjective: None examined at bedside and he is doing much better today.  Awake and more alert but wanting to go back to sleep.  Respirations remained stable and he did not require any more supplemental oxygen via nasal cannula.  He ambulated about the hallways without issues.  He is deemed medically stable to be discharged and was advised against dangers of illicit substances.  He understands agrees with plan of care.  Discharge Exam: Vitals:   01/14/21 1115 01/14/21 1200  BP: (!) 186/93   Pulse:    Resp:    Temp:  99.1 F (37.3 C)  SpO2:     Vitals:   01/14/21 1013 01/14/21 1100 01/14/21 1115 01/14/21 1200  BP: (!) 167/97  (!) 186/93   Pulse:  (!) 57    Resp:  (!) 9    Temp:    99.1 F (37.3 C)  TempSrc:    Oral  SpO2:  95%    Weight:      Height:       General: Pt is alert, awake, not in acute distress Cardiovascular: RRR, S1/S2 +, no rubs, no gallops Respiratory: Slightly diminished bilaterally, no wheezing, no rhonchi; Unlabored breathing and not wearing supplemental O2 via [place] Abdominal: Soft, NT, Distended 2/2 body habitus, bowel sounds  + Extremities: no edema, no cyanosis  The results of significant diagnostics from this hospitalization (including imaging, microbiology, ancillary and laboratory) are listed below for reference.    Microbiology: Recent Results (from the past 240 hour(s))  Resp Panel by RT-PCR (Flu A&B, Covid) Nasopharyngeal Swab     Status: None   Collection Time: 01/12/21  8:45 PM   Specimen: Nasopharyngeal Swab; Nasopharyngeal(NP) swabs in vial transport medium  Result Value Ref Range Status   SARS Coronavirus 2 by RT PCR NEGATIVE NEGATIVE Final    Comment: (NOTE) SARS-CoV-2 target nucleic acids are NOT DETECTED.  The SARS-CoV-2 RNA is generally detectable in upper respiratory specimens during the acute phase of infection. The lowest concentration of SARS-CoV-2 viral copies this assay can detect is 138 copies/mL. A negative result does not preclude SARS-Cov-2 infection and should not be used as the sole basis for treatment or other patient management decisions. A negative result may occur with  improper specimen collection/handling, submission of specimen other than nasopharyngeal swab, presence of viral mutation(s) within the areas targeted by this assay, and inadequate number of viral copies(<138 copies/mL). A negative result must be combined with clinical observations, patient history, and epidemiological information. The expected result is Negative.  Fact Sheet for Patients:  BloggerCourse.com  Fact Sheet for Healthcare Providers:  SeriousBroker.it  This test is no t yet approved or cleared by the [place] FDA and  has been authorized for detection and/or diagnosis of SARS-CoV-2 by FDA under an Emergency Use Authorization (EUA). This EUA will remain  in effect (meaning this test  can be used) for the duration of the COVID-19 declaration under Section 564(b)(1) of the Act, 21 U.S.C.section 360bbb-3(b)(1), unless the authorization is  terminated  or revoked sooner.       Influenza A by PCR NEGATIVE NEGATIVE Final   Influenza B by PCR NEGATIVE NEGATIVE Final    Comment: (NOTE) The Xpert Xpress SARS-CoV-2/FLU/RSV plus assay is intended as an aid in the diagnosis of influenza from Nasopharyngeal swab specimens and should not be used as a sole basis for treatment. Nasal washings and aspirates are unacceptable for Xpert Xpress SARS-CoV-2/FLU/RSV testing.  Fact Sheet for Patients: BloggerCourse.com  Fact Sheet for Healthcare Providers: SeriousBroker.it  This test is not yet approved or cleared by the [place] FDA and has been authorized for detection and/or diagnosis of SARS-CoV-2 by FDA under an Emergency Use Authorization (EUA). This EUA will remain in effect (meaning this test can be used) for the duration of the COVID-19 declaration under Section 564(b)(1) of the Act, 21 U.S.C. section 360bbb-3(b)(1), unless the authorization is terminated or revoked.  Performed at [facility], [address] [place], [place] [address]   MRSA Next Gen by PCR, Nasal     Status: None   Collection Time: 01/13/21 12:53 AM   Specimen: Nasal Mucosa; Nasal Swab  Result Value Ref Range Status   MRSA by PCR Next Gen NOT DETECTED NOT DETECTED Final    Comment: (NOTE) The GeneXpert MRSA Assay (FDA approved for NASAL specimens only), is one component of a comprehensive MRSA colonization surveillance program. It is not intended to diagnose MRSA infection nor to guide or monitor treatment for MRSA infections. Test performance is not FDA approved in patients less than 39 years old. Performed at [facility], [address] [place], [place] [address]     Labs: BNP (last 3 results) No results for input(s): BNP in the last 8760 hours. Basic Metabolic Panel: Recent Labs  Lab 01/12/21 2048 01/13/21 0307 01/14/21 0301  NA 139 139 138  K  4.6 4.1 3.6  CL 101 101 102  CO2 29 32 29  GLUCOSE 109* 113* 94  BUN 16 16 13   CREATININE 1.43* 1.15 1.06  CALCIUM 9.0 9.0 9.0  MG  --   --  1.9  PHOS  --   --  2.1*   Liver Function Tests: Recent Labs  Lab 01/12/21 2048 01/14/21 0301  AST 32 19  ALT 24 20  ALKPHOS 53 43  BILITOT 0.7 0.8  PROT 7.2 6.3*  ALBUMIN 4.6 3.7   No results for input(s): LIPASE, AMYLASE in the last 168 hours. No results for input(s): AMMONIA in the last 168 hours. CBC: Recent Labs  Lab 01/12/21 2048 01/13/21 0307 01/14/21 0301  WBC 9.4 7.2 7.4  NEUTROABS 7.9*  --  4.6  HGB 16.1 14.4 15.7  HCT 48.9 42.4 45.9  MCV 91.9 89.6 89.0  PLT 257 240 224   Cardiac Enzymes: No results for input(s): CKTOTAL, CKMB, CKMBINDEX, TROPONINI in the last 168 hours. BNP: Invalid input(s): POCBNP CBG: Recent Labs  Lab 01/12/21 2050  GLUCAP 121*   D-Dimer No results for input(s): DDIMER in the last 72 hours. Hgb A1c No results for input(s): HGBA1C in the last 72 hours. Lipid Profile No results for input(s): CHOL, HDL, LDLCALC, TRIG, CHOLHDL, LDLDIRECT in the last 72 hours. Thyroid function studies No results for input(s): TSH, T4TOTAL, T3FREE, THYROIDAB in the last 72 hours.  Invalid input(s): FREET3 Anemia work up No results for  input(s): VITAMINB12, FOLATE, FERRITIN, TIBC, IRON, RETICCTPCT in the last 72 hours. Urinalysis    Component Value Date/Time   COLORURINE YELLOW 07/01/2020 0930   APPEARANCEUR CLEAR 07/01/2020 0930   LABSPEC 1.009 07/01/2020 0930   PHURINE 7.0 07/01/2020 0930   GLUCOSEU NEGATIVE 07/01/2020 0930   HGBUR NEGATIVE 07/01/2020 0930   BILIRUBINUR NEGATIVE 07/01/2020 0930   BILIRUBINUR negative 03/21/2020 1101   KETONESUR NEGATIVE 07/01/2020 0930   PROTEINUR NEGATIVE 07/01/2020 0930   UROBILINOGEN 0.2 03/21/2020 1101   NITRITE NEGATIVE 07/01/2020 0930   LEUKOCYTESUR NEGATIVE 07/01/2020 0930   Sepsis Labs Invalid input(s): PROCALCITONIN,  WBC,   LACTICIDVEN Microbiology Recent Results (from the past 240 hour(s))  Resp Panel by RT-PCR (Flu A&B, Covid) Nasopharyngeal Swab     Status: None   Collection Time: 01/12/21  8:45 PM   Specimen: Nasopharyngeal Swab; Nasopharyngeal(NP) swabs in vial transport medium  Result Value Ref Range Status   SARS Coronavirus 2 by RT PCR NEGATIVE NEGATIVE Final    Comment: (NOTE) SARS-CoV-2 target nucleic acids are NOT DETECTED.  The SARS-CoV-2 RNA is generally detectable in upper respiratory specimens during the acute phase of infection. The lowest concentration of SARS-CoV-2 viral copies this assay can detect is 138 copies/mL. A negative result does not preclude SARS-Cov-2 infection and should not be used as the sole basis for treatment or other patient management decisions. A negative result may occur with  improper specimen collection/handling, submission of specimen other than nasopharyngeal swab, presence of viral mutation(s) within the areas targeted by this assay, and inadequate number of viral copies(<138 copies/mL). A negative result must be combined with clinical observations, patient history, and epidemiological information. The expected result is Negative.  Fact Sheet for Patients:  BloggerCourse.com  Fact Sheet for Healthcare Providers:  SeriousBroker.it  This test is no t yet approved or cleared by the [place] FDA and  has been authorized for detection and/or diagnosis of SARS-CoV-2 by FDA under an Emergency Use Authorization (EUA). This EUA will remain  in effect (meaning this test can be used) for the duration of the COVID-19 declaration under Section 564(b)(1) of the Act, 21 U.S.C.section 360bbb-3(b)(1), unless the authorization is terminated  or revoked sooner.       Influenza A by PCR NEGATIVE NEGATIVE Final   Influenza B by PCR NEGATIVE NEGATIVE Final    Comment: (NOTE) The Xpert Xpress SARS-CoV-2/FLU/RSV plus  assay is intended as an aid in the diagnosis of influenza from Nasopharyngeal swab specimens and should not be used as a sole basis for treatment. Nasal washings and aspirates are unacceptable for Xpert Xpress SARS-CoV-2/FLU/RSV testing.  Fact Sheet for Patients: BloggerCourse.com  Fact Sheet for Healthcare Providers: SeriousBroker.it  This test is not yet approved or cleared by the [place] FDA and has been authorized for detection and/or diagnosis of SARS-CoV-2 by FDA under an Emergency Use Authorization (EUA). This EUA will remain in effect (meaning this test can be used) for the duration of the COVID-19 declaration under Section 564(b)(1) of the Act, 21 U.S.C. section 360bbb-3(b)(1), unless the authorization is terminated or revoked.  Performed at [facility], [address], [place], [place] [address]   MRSA Next Gen by PCR, Nasal     Status: None   Collection Time: 01/13/21 12:53 AM   Specimen: Nasal Mucosa; Nasal Swab  Result Value Ref Range Status   MRSA by PCR Next Gen NOT DETECTED NOT DETECTED Final    Comment: (NOTE) The GeneXpert MRSA Assay (FDA approved for  NASAL specimens only), is one component of a comprehensive MRSA colonization surveillance program. It is not intended to diagnose MRSA infection nor to guide or monitor treatment for MRSA infections. Test performance is not FDA approved in patients less than 77 years old. Performed at [facility], [address] [place], [place] [address]    Time coordinating discharge: 35 minutes  SIGNED:  Merlene Laughter, DO [facility] 01/14/2021, 12:19 PM Pager is on AMION  If 7PM-7AM, please contact night-coverage [email or web address]

## 2021-01-14 NOTE — Progress Notes (Signed)
Vape pen found in pt bed by Lesly Rubenstein, RN and confirmed to be the SAME orange and black vape pen sent home with girlfriend Antony Salmon) the night before.   Vape pen will be placed in biohazard bag and taped into pt chart until discharge.  Pt girlfriend has been called but did not answer.

## 2021-02-17 ENCOUNTER — Other Ambulatory Visit: Payer: Self-pay

## 2021-02-17 ENCOUNTER — Encounter (HOSPITAL_COMMUNITY): Payer: Self-pay

## 2021-02-17 ENCOUNTER — Other Ambulatory Visit (HOSPITAL_COMMUNITY)
Admission: EM | Admit: 2021-02-17 | Discharge: 2021-02-18 | Disposition: A | Discharge status: Home / Self Care | Payer: No Payment, Other | Attending: Psychiatry | Admitting: Psychiatry

## 2021-02-17 ENCOUNTER — Emergency Department (HOSPITAL_COMMUNITY)
Admission: EM | Admit: 2021-02-17 | Discharge: 2021-02-17 | Disposition: A | Discharge status: Other Acute Inpatient Hospital | Payer: Self-pay | Attending: Emergency Medicine | Admitting: Emergency Medicine

## 2021-02-17 DIAGNOSIS — T402X1A Poisoning by other opioids, accidental (unintentional), initial encounter: Secondary | ICD-10-CM | POA: Insufficient documentation

## 2021-02-17 DIAGNOSIS — Z79899 Other long term (current) drug therapy: Secondary | ICD-10-CM | POA: Insufficient documentation

## 2021-02-17 DIAGNOSIS — Z9151 Personal history of suicidal behavior: Secondary | ICD-10-CM | POA: Insufficient documentation

## 2021-02-17 DIAGNOSIS — F1994 Other psychoactive substance use, unspecified with psychoactive substance-induced mood disorder: Secondary | ICD-10-CM | POA: Insufficient documentation

## 2021-02-17 DIAGNOSIS — Z87891 Personal history of nicotine dependence: Secondary | ICD-10-CM | POA: Diagnosis not present

## 2021-02-17 DIAGNOSIS — T50902A Poisoning by unspecified drugs, medicaments and biological substances, intentional self-harm, initial encounter: Secondary | ICD-10-CM

## 2021-02-17 DIAGNOSIS — R45851 Suicidal ideations: Secondary | ICD-10-CM | POA: Insufficient documentation

## 2021-02-17 DIAGNOSIS — Z20822 Contact with and (suspected) exposure to covid-19: Secondary | ICD-10-CM | POA: Insufficient documentation

## 2021-02-17 DIAGNOSIS — F1190 Opioid use, unspecified, uncomplicated: Secondary | ICD-10-CM

## 2021-02-17 DIAGNOSIS — F1420 Cocaine dependence, uncomplicated: Secondary | ICD-10-CM | POA: Insufficient documentation

## 2021-02-17 DIAGNOSIS — I10 Essential (primary) hypertension: Secondary | ICD-10-CM | POA: Insufficient documentation

## 2021-02-17 DIAGNOSIS — F1410 Cocaine abuse, uncomplicated: Secondary | ICD-10-CM

## 2021-02-17 DIAGNOSIS — F1124 Opioid dependence with opioid-induced mood disorder: Secondary | ICD-10-CM | POA: Insufficient documentation

## 2021-02-17 DIAGNOSIS — T405X1A Poisoning by cocaine, accidental (unintentional), initial encounter: Secondary | ICD-10-CM | POA: Insufficient documentation

## 2021-02-17 LAB — LIPID PANEL
Cholesterol: 184 mg/dL (ref 0–200)
HDL: 54 mg/dL (ref >40)
LDL Cholesterol: 101 mg/dL — ABNORMAL HIGH (ref 0–99)
Total CHOL/HDL Ratio: 3.4 RATIO
Triglycerides: 144 mg/dL (ref <150)
VLDL: 29 mg/dL (ref 0–40)

## 2021-02-17 LAB — POCT URINE DRUG SCREEN - MANUAL ENTRY (I-SCREEN)
POC Amphetamine UR: NOT DETECTED
POC Buprenorphine (BUP): POSITIVE — AB
POC Cocaine UR: NOT DETECTED
POC Marijuana UR: NOT DETECTED
POC Methadone UR: NOT DETECTED
POC Methamphetamine UR: NOT DETECTED
POC Morphine: NOT DETECTED
POC Oxazepam (BZO): NOT DETECTED
POC Oxycodone UR: NOT DETECTED
POC Secobarbital (BAR): NOT DETECTED

## 2021-02-17 LAB — CBC WITH DIFFERENTIAL/PLATELET
Abs Immature Granulocytes: 0.04 10*3/uL (ref 0.00–0.07)
Basophils Absolute: 0 10*3/uL (ref 0.0–0.1)
Basophils Relative: 0 %
Eosinophils Absolute: 0.1 10*3/uL (ref 0.0–0.5)
Eosinophils Relative: 2 %
HCT: 43 % (ref 39.0–52.0)
Hemoglobin: 14.6 g/dL (ref 13.0–17.0)
Immature Granulocytes: 1 %
Lymphocytes Relative: 41 %
Lymphs Abs: 2.3 10*3/uL (ref 0.7–4.0)
MCH: 30 pg (ref 26.0–34.0)
MCHC: 34 g/dL (ref 30.0–36.0)
MCV: 88.5 fL (ref 80.0–100.0)
Monocytes Absolute: 0.6 10*3/uL (ref 0.1–1.0)
Monocytes Relative: 10 %
Neutro Abs: 2.7 10*3/uL (ref 1.7–7.7)
Neutrophils Relative %: 46 %
Platelets: 199 10*3/uL (ref 150–400)
RBC: 4.86 MIL/uL (ref 4.22–5.81)
RDW: 12.5 % (ref 11.5–15.5)
WBC: 5.7 10*3/uL (ref 4.0–10.5)
nRBC: 0 % (ref 0.0–0.2)

## 2021-02-17 LAB — HEMOGLOBIN A1C
Hgb A1c MFr Bld: 5.3 % (ref 4.8–5.6)
Mean Plasma Glucose: 105.41 mg/dL

## 2021-02-17 LAB — COMPREHENSIVE METABOLIC PANEL
ALT: 16 U/L (ref 0–44)
AST: 22 U/L (ref 15–41)
Albumin: 3.9 g/dL (ref 3.5–5.0)
Alkaline Phosphatase: 46 U/L (ref 38–126)
Anion gap: 9 (ref 5–15)
BUN: 23 mg/dL — ABNORMAL HIGH (ref 6–20)
CO2: 28 mmol/L (ref 22–32)
Calcium: 9.1 mg/dL (ref 8.9–10.3)
Chloride: 101 mmol/L (ref 98–111)
Creatinine, Ser: 1.16 mg/dL (ref 0.61–1.24)
GFR, Estimated: >60 mL/min (ref >60)
Glucose, Bld: 97 mg/dL (ref 70–99)
Potassium: 3.8 mmol/L (ref 3.5–5.1)
Sodium: 138 mmol/L (ref 135–145)
Total Bilirubin: 0.6 mg/dL (ref 0.3–1.2)
Total Protein: 6.4 g/dL — ABNORMAL LOW (ref 6.5–8.1)

## 2021-02-17 LAB — RESP PANEL BY RT-PCR (FLU A&B, COVID) ARPGX2
Influenza A by PCR: NEGATIVE
Influenza B by PCR: NEGATIVE
SARS Coronavirus 2 by RT PCR: NEGATIVE

## 2021-02-17 LAB — TSH: TSH: 1.131 u[IU]/mL (ref 0.350–4.500)

## 2021-02-17 LAB — ACETAMINOPHEN LEVEL: Acetaminophen (Tylenol), Serum: <10 ug/mL — ABNORMAL LOW (ref 10–30)

## 2021-02-17 LAB — ETHANOL: Alcohol, Ethyl (B): <10 mg/dL (ref <10)

## 2021-02-17 LAB — SALICYLATE LEVEL: Salicylate Lvl: <7 mg/dL — ABNORMAL LOW (ref 7.0–30.0)

## 2021-02-17 MED ORDER — DICYCLOMINE HCL 20 MG PO TABS: 20.0000 mg | ORAL_TABLET | Freq: Four times a day (QID) | ORAL | Status: DC | PRN

## 2021-02-17 MED ORDER — TRAZODONE HCL 50 MG PO TABS: 50.0000 mg | ORAL_TABLET | Freq: Every evening | ORAL | Status: DC | PRN

## 2021-02-17 MED ORDER — DIPHENHYDRAMINE HCL 50 MG PO CAPS
50.0000 mg | ORAL_CAPSULE | Freq: Once | ORAL | Status: AC
  Administered 2021-02-17: 50 mg via ORAL
  Filled 2021-02-17: qty 1

## 2021-02-17 MED ORDER — METHOCARBAMOL 500 MG PO TABS: 500.0000 mg | ORAL_TABLET | Freq: Three times a day (TID) | ORAL | Status: DC | PRN

## 2021-02-17 MED ORDER — LISINOPRIL 10 MG PO TABS
10.0000 mg | ORAL_TABLET | Freq: Every day | ORAL | Status: DC
  Filled 2021-02-17: qty 1

## 2021-02-17 MED ORDER — CLONIDINE HCL 0.1 MG PO TABS
0.1000 mg | ORAL_TABLET | Freq: Four times a day (QID) | ORAL | Status: DC
Start: 2021-02-17 — End: 2021-02-18
  Administered 2021-02-17 (×2): 0.1 mg via ORAL
  Filled 2021-02-17 (×2): qty 1

## 2021-02-17 MED ORDER — GABAPENTIN 100 MG PO CAPS
100.0000 mg | ORAL_CAPSULE | Freq: Two times a day (BID) | ORAL | Status: DC
  Administered 2021-02-17 – 2021-02-18 (×2): 100 mg via ORAL
  Filled 2021-02-17 (×2): qty 1

## 2021-02-17 MED ORDER — ACETAMINOPHEN 325 MG PO TABS
650.0000 mg | ORAL_TABLET | Freq: Four times a day (QID) | ORAL | Status: DC | PRN
Start: 2021-02-17 — End: 2021-02-18
  Administered 2021-02-17: 650 mg via ORAL
  Filled 2021-02-17: qty 2

## 2021-02-17 MED ORDER — CLONIDINE HCL 0.1 MG PO TABS
0.1000 mg | ORAL_TABLET | Freq: Every day | ORAL | Status: DC
Start: 2021-02-22 — End: 2021-02-18

## 2021-02-17 MED ORDER — ALUM & MAG HYDROXIDE-SIMETH 200-200-20 MG/5ML PO SUSP: 30.0000 mL | ORAL | Status: DC | PRN

## 2021-02-17 MED ORDER — NAPROXEN 500 MG PO TABS: 500.0000 mg | ORAL_TABLET | Freq: Two times a day (BID) | ORAL | Status: DC | PRN

## 2021-02-17 MED ORDER — ONDANSETRON 4 MG PO TBDP
4.0000 mg | ORAL_TABLET | Freq: Four times a day (QID) | ORAL | Status: DC | PRN
Start: 2021-02-17 — End: 2021-02-18

## 2021-02-17 MED ORDER — CLONIDINE HCL 0.1 MG PO TABS
0.1000 mg | ORAL_TABLET | ORAL | Status: DC
Start: 2021-02-20 — End: 2021-02-18

## 2021-02-17 MED ORDER — HYDROXYZINE HCL 25 MG PO TABS: 25.0000 mg | ORAL_TABLET | Freq: Three times a day (TID) | ORAL | Status: DC | PRN

## 2021-02-17 MED ORDER — MAGNESIUM HYDROXIDE 400 MG/5ML PO SUSP: 30.0000 mL | Freq: Every day | ORAL | Status: DC | PRN

## 2021-02-17 MED ORDER — LOPERAMIDE HCL 2 MG PO CAPS: 2.0000 mg | ORAL_CAPSULE | ORAL | Status: DC | PRN

## 2021-02-17 NOTE — Group Note (Signed)
Group Topic: Social Support  Group Date: 02/17/2021 Start Time: 1930 End Time: 2000 Facilitators: Armandina Stammer, [place]  Department: [facility]  Number of Participants: 4  Group Focus: check in Treatment Modality:  Cognitive Behavioral Therapy Interventions utilized were exploration, story telling, and support Purpose: enhance coping skills, express feelings, and improve communication skills  Name: Erik Campbell Date of Birth: 11-22-1980  MR: [id number]    Level of Participation: minimal Quality of Participation: quiet and superficial Interactions with others: NONE Mood/Affect: blunted, closed / guarded, depressed, and flat Triggers (if applicable): N/A Cognition: no insight Progress: None Response: N/A Plan: follow-up needed  Pt stated that they were transferred here from the hospital, but did not have any new insights today  Patients Problems:  Patient Active Problem List   Diagnosis Date Noted   Substance induced mood disorder (HCC) 02/17/2021   Polysubstance overdose 01/12/2021   Acute respiratory failure with hypoxia (HCC) 01/12/2021   AKI (acute kidney injury) (HCC) 01/12/2021   Heroin abuse (HCC) 07/23/2020   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

## 2021-02-17 NOTE — ED Provider Notes (Signed)
Behavioral Health Admission [facility]&P [facility] & OBS)  Date: 02/17/21 Patient Name: Erik Campbell MRN: [id number]  Diagnoses:  Final diagnoses:  Substance induced mood disorder (HCC)    HPI: Erik Campbell is a 40 y.o. male with a history of opioid use disorder, cocaine use disorder, recurrent major depressive disorder, HTN, and previous suicide attempt who presented the emergency department with a chief complaint of suicide attempt. Patient was transferred to the [facility] for mood stabilization and safety. On evaluation, patient is alert and oriented x 4. His thought process is logical and speech is coherent. His mood is dysphoric and affect is congruent. He reports that he went to [facility] after he took too much "dope." He reports having suicidal thoughts with plan to get high and not wake up. He reports using dope which is know as heroin everyday, on average 1/2 g for the past two months. He reports using coke every once in a while, on average a half a gram on and off. He reports that he last used  coke last week. He reports drinking alcohol, on average once a month, a couple beers x20 years. He reports that he started living at [facility] and recently relapsed on opiates and was encouraged to go to [facility] in [place] for detox. He reports that on Thursday he was discharged from [facility] because his blood pressure was too high. He reports feeling like he was discharged too early. He reports that he is interested in substance abuse treatment and would like to go to residential treatment for longer substance abuse treatment. He reports feeling hopeless for the past month. He reports having anxiety and describes his symptoms as excessive worrying for past month. He denies having thoughts of wanting to hurt himself or others currently. He denies hearing voices or seeing things that other people cannot hear or see.  He does not appear to be responding to internal or external stimuli. He voices concerns for possible  withdrawals from opiates and asked if we do Suboxone treatment at this facility. He was advised that we do clonidine detox protocol for opiate use. He denies a history of prior psychiatric inpatient hospitalizations He denies taking psychotropics in the past. He reports a medical history of hypertension and states that he takes lisinopril 20 mg po daily.  PHQ 2-9:   Flowsheet Row ED from 02/17/2021 in [facility] Most recent reading at 02/17/2021  4:20 PM ED from 02/17/2021 in [facility] Most recent reading at 02/17/2021  3:56 AM ED to Hosp-Admission (Discharged) from 01/12/2021 in [facility]ICU/STEPDOWN Most recent reading at 01/12/2021  6:39 PM  C-SSRS RISK CATEGORY No Risk High Risk No Risk        Total Time spent with patient: 30 minutes  Musculoskeletal  Strength & Muscle Tone: within normal limits Gait & Station: normal Patient leans: N/A  Psychiatric Specialty Exam  Presentation General Appearance: Appropriate for Environment  Eye Contact:Fair  Speech:Clear and Coherent  Speech Volume:Normal  Handedness:No data recorded  Mood and Affect  Mood:Dysphoric  Affect:Congruent   Thought Process  Thought Processes:Coherent; Goal Directed  Descriptions of Associations:Intact  Orientation:Full (Time, Place and Person)  Thought Content:Logical  Diagnosis of Schizophrenia or Schizoaffective disorder in past: No   Hallucinations:Hallucinations: None  Ideas of Reference:None  Suicidal Thoughts:Suicidal Thoughts: No  Homicidal Thoughts:Homicidal Thoughts: No   Sensorium  Memory:Immediate Fair; Recent Fair; Remote Fair  Judgment:Fair  Insight:Fair   Executive Functions  Concentration:Fair  Attention Span:Fair  Recall:Fair  Fund of Knowledge:Fair  Language:Good   Psychomotor Activity  Psychomotor Activity:Psychomotor Activity: Normal   Assets  Assets:Communication  Skills; Desire for Improvement   Sleep  Sleep:Sleep: Poor   No data recorded  Physical Exam Constitutional:      Appearance: Normal appearance.  HENT:     Head: Atraumatic.     Nose: Nose normal.  Eyes:     Conjunctiva/sclera: Conjunctivae normal.  Cardiovascular:     Rate and Rhythm: Normal rate.  Pulmonary:     Effort: Pulmonary effort is normal.  Musculoskeletal:        General: Normal range of motion.  Neurological:     Mental Status: He is alert and oriented to person, place, and time.   Review of Systems  Constitutional: Negative.   HENT: Negative.    Eyes: Negative.   Respiratory: Negative.    Cardiovascular: Negative.   Gastrointestinal: Negative.   Genitourinary: Negative.   Musculoskeletal: Negative.   Skin: Negative.   Neurological: Negative.   Endo/Heme/Allergies: Negative.   Psychiatric/Behavioral:  Positive for depression, substance abuse and suicidal ideas. The patient is nervous/anxious.    Blood pressure (!) 145/101, pulse (!) 53, temperature 98.5 F (36.9 C), temperature source Oral, resp. rate 20, SpO2 100 %. There is no height or weight on file to calculate BMI.  Past Psychiatric History: cocaine use dx, opiate use dx and MDD Is the patient at risk to self? Yes  Has the patient been a risk to self in the past 6 months? Unknown  Has the patient been a risk to self within the distant past? Unknown  Is the patient a risk to others? No Has the patient been a risk to others in the past 6 months? Unknown  Has the patient been a risk to others within the distant past? Unknown   Past Medical History:  Past Medical History:  Diagnosis Date   Hypertension    No past surgical history on file.  Family History: No family history on file.  Social History:  Social History   Socioeconomic History   Marital status: Single    Spouse name: Not on file   Number of children: Not on file   Years of education: Not on file   Highest education level: Not  on file  Occupational History   Not on file  Tobacco Use   Smoking status: Former   Smokeless tobacco: Never  Vaping Use   Vaping Use: Never used  Substance and Sexual Activity   Alcohol use: Not Currently    Comment: not in the past 30 days    Drug use: Not Currently    Types: "Crack" cocaine, Heroin    Comment: not in the past 30 days    Sexual activity: Not Currently    Comment: not in the past 30 days   Other Topics Concern   Not on file  Social History Narrative   Not on file   Social Determinants of Health   Financial Resource Strain: Not on file  Food Insecurity: Not on file  Transportation Needs: Not on file  Physical Activity: Not on file  Stress: Not on file  Social Connections: Not on file  Intimate Partner Violence: Not on file    SDOH:  SDOH Screenings   Alcohol Screen: Low Risk    Last Alcohol Screening Score (AUDIT): 1  Depression (PHQ2-9): Low Risk    PHQ-2 Score: 0  Financial Resource Strain: Not on file  Food  Insecurity: Not on file  Housing: Not on file  Physical Activity: Not on file  Social Connections: Not on file  Stress: Not on file  Tobacco Use: Medium Risk   Smoking Tobacco Use: Former   Smokeless Tobacco Use: Never  Transportation Needs: Not on file    Last Labs:  Admission on 02/17/2021, Discharged on 02/17/2021  Component Date Value Ref Range Status   SARS Coronavirus 2 by RT PCR 02/17/2021 NEGATIVE  NEGATIVE Final   Comment: (NOTE) SARS-CoV-2 target nucleic acids are NOT DETECTED.  The SARS-CoV-2 RNA is generally detectable in upper respiratory specimens during the acute phase of infection. The lowest concentration of SARS-CoV-2 viral copies this assay can detect is 138 copies/mL. A negative result does not preclude SARS-Cov-2 infection and should not be used as the sole basis for treatment or other patient management decisions. A negative result may occur with  improper specimen collection/handling, submission of specimen  other than nasopharyngeal swab, presence of viral mutation(s) within the areas targeted by this assay, and inadequate number of viral copies(<138 copies/mL). A negative result must be combined with clinical observations, patient history, and epidemiological information. The expected result is Negative.  Fact Sheet for Patients:  BloggerCourse.com  Fact Sheet for Healthcare Providers:  SeriousBroker.it  This test is no                          t yet approved or cleared by the [place] FDA and  has been authorized for detection and/or diagnosis of SARS-CoV-2 by FDA under an Emergency Use Authorization (EUA). This EUA will remain  in effect (meaning this test can be used) for the duration of the COVID-19 declaration under Section 564(b)(1) of the Act, 21 U.S.C.section 360bbb-3(b)(1), unless the authorization is terminated  or revoked sooner.       Influenza A by PCR 02/17/2021 NEGATIVE  NEGATIVE Final   Influenza B by PCR 02/17/2021 NEGATIVE  NEGATIVE Final   Comment: (NOTE) The Xpert Xpress SARS-CoV-2/FLU/RSV plus assay is intended as an aid in the diagnosis of influenza from Nasopharyngeal swab specimens and should not be used as a sole basis for treatment. Nasal washings and aspirates are unacceptable for Xpert Xpress SARS-CoV-2/FLU/RSV testing.  Fact Sheet for Patients: BloggerCourse.com  Fact Sheet for Healthcare Providers: SeriousBroker.it  This test is not yet approved or cleared by the [place] FDA and has been authorized for detection and/or diagnosis of SARS-CoV-2 by FDA under an Emergency Use Authorization (EUA). This EUA will remain in effect (meaning this test can be used) for the duration of the COVID-19 declaration under Section 564(b)(1) of the Act, 21 U.S.C. section 360bbb-3(b)(1), unless the authorization is terminated or revoked.  Performed at  [facility], [address] [place], [place] [address]    Sodium 02/17/2021 138  135 - 145 mmol/L Final   Potassium 02/17/2021 3.8  3.5 - 5.1 mmol/L Final   Chloride 02/17/2021 101  98 - 111 mmol/L Final   CO2 02/17/2021 28  22 - 32 mmol/L Final   Glucose, Bld 02/17/2021 97  70 - 99 mg/dL Final   Glucose reference range applies only to samples taken after fasting for at least 8 hours.   BUN 02/17/2021 23 (A) 6 - 20 mg/dL Final   Creatinine, Ser 02/17/2021 1.16  0.61 - 1.24 mg/dL Final   Calcium 70/96/2836 9.1  8.9 - 10.3 mg/dL Final   Total Protein 62/94/7654 6.4 (A) 6.5 - 8.1  g/dL Final   Albumin 29/51/8841 3.9  3.5 - 5.0 g/dL Final   AST 66/10/3014 22  15 - 41 U/L Final   ALT 02/17/2021 16  0 - 44 U/L Final   Alkaline Phosphatase 02/17/2021 46  38 - 126 U/L Final   Total Bilirubin 02/17/2021 0.6  0.3 - 1.2 mg/dL Final   GFR, Estimated 02/17/2021 >60  >60 mL/min Final   Comment: (NOTE) Calculated using the CKD-EPI Creatinine Equation (2021)    Anion gap 02/17/2021 9  5 - 15 Final   Performed at [facility], [address] [place], [place] [address]   Alcohol, Ethyl (B) 02/17/2021 <10  <10 mg/dL Final   Comment: (NOTE) Lowest detectable limit for serum alcohol is 10 mg/dL.  For medical purposes only. Performed at [facility], [address] [place], [place] [address]    WBC 02/17/2021 5.7  4.0 - 10.5 K/uL Final   RBC 02/17/2021 4.86  4.22 - 5.81 MIL/uL Final   Hemoglobin 02/17/2021 14.6  13.0 - 17.0 g/dL Final   HCT 32/20/2542 43.0  39.0 - 52.0 % Final   MCV 02/17/2021 88.5  80.0 - 100.0 fL Final   MCH 02/17/2021 30.0  26.0 - 34.0 pg Final   MCHC 02/17/2021 34.0  30.0 - 36.0 g/dL Final   RDW 70/62/3762 12.5  11.5 - 15.5 % Final   Platelets 02/17/2021 199  150 - 400 K/uL Final   nRBC 02/17/2021 0.0  0.0 - 0.2 % Final   Neutrophils Relative % 02/17/2021 46  % Final   Neutro Abs 02/17/2021 2.7  1.7 - 7.7 K/uL Final    Lymphocytes Relative 02/17/2021 41  % Final   Lymphs Abs 02/17/2021 2.3  0.7 - 4.0 K/uL Final   Monocytes Relative 02/17/2021 10  % Final   Monocytes Absolute 02/17/2021 0.6  0.1 - 1.0 K/uL Final   Eosinophils Relative 02/17/2021 2  % Final   Eosinophils Absolute 02/17/2021 0.1  0.0 - 0.5 K/uL Final   Basophils Relative 02/17/2021 0  % Final   Basophils Absolute 02/17/2021 0.0  0.0 - 0.1 K/uL Final   Immature Granulocytes 02/17/2021 1  % Final   Abs Immature Granulocytes 02/17/2021 0.04  0.00 - 0.07 K/uL Final   Performed at [facility], [address] [place], [place] [address]   Acetaminophen (Tylenol), Serum 02/17/2021 <10 (A) 10 - 30 ug/mL Final   Comment: (NOTE) Therapeutic concentrations vary significantly. A range of 10-30 ug/mL  may be an effective concentration for many patients. However, some  are best treated at concentrations outside of this range. Acetaminophen concentrations >150 ug/mL at 4 hours after ingestion  and >50 ug/mL at 12 hours after ingestion are often associated with  toxic reactions.  Performed at [facility], [address] [place], [place] [address]    Salicylate Lvl 02/17/2021 <7.0 (A) 7.0 - 30.0 mg/dL Final   Performed at [facility], [address] [place], [place] [address]  Admission on 01/12/2021, Discharged on 01/14/2021  Component Date Value Ref Range Status   Glucose-Capillary 01/12/2021 121 (A) 70 - 99 mg/dL Final   Glucose reference range applies only to samples taken after fasting for at least 8 hours.   Sodium 01/12/2021 139  135 - 145 mmol/L Final   Potassium 01/12/2021 4.6  3.5 - 5.1 mmol/L Final   Chloride 01/12/2021 101  98 - 111 mmol/L Final   CO2 01/12/2021 29  22 -  32 mmol/L Final   Glucose, Bld 01/12/2021 109 (A) 70 - 99 mg/dL Final   Glucose reference range applies only to samples taken after fasting for at least 8 hours.   BUN 01/12/2021 16  6 - 20 mg/dL Final    Creatinine, Ser 01/12/2021 1.43 (A) 0.61 - 1.24 mg/dL Final   Calcium 96/08/5407 9.0  8.9 - 10.3 mg/dL Final   Total Protein 81/19/1478 7.2  6.5 - 8.1 g/dL Final   Albumin 29/56/2130 4.6  3.5 - 5.0 g/dL Final   AST 86/57/8469 32  15 - 41 U/L Final   ALT 01/12/2021 24  0 - 44 U/L Final   Alkaline Phosphatase 01/12/2021 53  38 - 126 U/L Final   Total Bilirubin 01/12/2021 0.7  0.3 - 1.2 mg/dL Final   GFR, Estimated 01/12/2021 >60  >60 mL/min Final   Comment: (NOTE) Calculated using the CKD-EPI Creatinine Equation (2021)    Anion gap 01/12/2021 9  5 - 15 Final   Performed at [facility], [address], [place], [place] [address]   Salicylate Lvl 01/12/2021 <7.0 (A) 7.0 - 30.0 mg/dL Final   Performed at [facility], [address] [place], [place] [address]   Acetaminophen (Tylenol), Serum 01/12/2021 <10 (A) 10 - 30 ug/mL Final   Comment: (NOTE) Therapeutic concentrations vary significantly. A range of 10-30 ug/mL  may be an effective concentration for many patients. However, some  are best treated at concentrations outside of this range. Acetaminophen concentrations >150 ug/mL at 4 hours after ingestion  and >50 ug/mL at 12 hours after ingestion are often associated with  toxic reactions.  Performed at [facility], [address], [place], [place] [address]    Alcohol, Ethyl (B) 01/12/2021 <10  <10 mg/dL Final   Comment: (NOTE) Lowest detectable limit for serum alcohol is 10 mg/dL.  For medical purposes only. Performed at [facility], [address] [place], [place] [address]    Opiates 01/12/2021 NONE DETECTED  NONE DETECTED Final   Cocaine 01/12/2021 POSITIVE (A) NONE DETECTED Final   Benzodiazepines 01/12/2021 NONE DETECTED  NONE DETECTED Final   Amphetamines 01/12/2021 NONE DETECTED  NONE DETECTED Final   Tetrahydrocannabinol 01/12/2021 NONE DETECTED  NONE DETECTED Final   Barbiturates  01/12/2021 NONE DETECTED  NONE DETECTED Final   Comment: (NOTE) DRUG SCREEN FOR MEDICAL PURPOSES ONLY.  IF CONFIRMATION IS NEEDED FOR ANY PURPOSE, NOTIFY LAB WITHIN 5 DAYS.  LOWEST DETECTABLE LIMITS FOR URINE DRUG SCREEN Drug Class                     Cutoff (ng/mL) Amphetamine and metabolites    1000 Barbiturate and metabolites    200 Benzodiazepine                 200 Tricyclics and metabolites     300 Opiates and metabolites        300 Cocaine and metabolites        300 THC                            50 Performed at [facility], [address] [place], [place] [address]    WBC 01/12/2021 9.4  4.0 - 10.5 K/uL Final   RBC 01/12/2021 5.32  4.22 - 5.81 MIL/uL Final   Hemoglobin 01/12/2021 16.1  13.0 - 17.0 g/dL Final   HCT 34/74/2595 48.9  39.0 - 52.0 % Final   MCV 01/12/2021 91.9  80.0 - 100.0 fL Final   MCH 01/12/2021 30.3  26.0 - 34.0 pg Final   MCHC 01/12/2021 32.9  30.0 - 36.0 g/dL Final   RDW 16/02/9603 13.1  11.5 - 15.5 % Final   Platelets 01/12/2021 257  150 - 400 K/uL Final   nRBC 01/12/2021 0.0  0.0 - 0.2 % Final   Neutrophils Relative % 01/12/2021 85  % Final   Neutro Abs 01/12/2021 7.9 (A) 1.7 - 7.7 K/uL Final   Lymphocytes Relative 01/12/2021 9  % Final   Lymphs Abs 01/12/2021 0.8  0.7 - 4.0 K/uL Final   Monocytes Relative 01/12/2021 6  % Final   Monocytes Absolute 01/12/2021 0.6  0.1 - 1.0 K/uL Final   Eosinophils Relative 01/12/2021 0  % Final   Eosinophils Absolute 01/12/2021 0.0  0.0 - 0.5 K/uL Final   Basophils Relative 01/12/2021 0  % Final   Basophils Absolute 01/12/2021 0.0  0.0 - 0.1 K/uL Final   Immature Granulocytes 01/12/2021 0  % Final   Abs Immature Granulocytes 01/12/2021 0.04  0.00 - 0.07 K/uL Final   Performed at [facility], [address], [place], [place] [address]   SARS Coronavirus 2 by RT PCR 01/12/2021 NEGATIVE  NEGATIVE Final   Comment: (NOTE) SARS-CoV-2 target nucleic acids are NOT  DETECTED.  The SARS-CoV-2 RNA is generally detectable in upper respiratory specimens during the acute phase of infection. The lowest concentration of SARS-CoV-2 viral copies this assay can detect is 138 copies/mL. A negative result does not preclude SARS-Cov-2 infection and should not be used as the sole basis for treatment or other patient management decisions. A negative result may occur with  improper specimen collection/handling, submission of specimen other than nasopharyngeal swab, presence of viral mutation(s) within the areas targeted by this assay, and inadequate number of viral copies(<138 copies/mL). A negative result must be combined with clinical observations, patient history, and epidemiological information. The expected result is Negative.  Fact Sheet for Patients:  BloggerCourse.com  Fact Sheet for Healthcare Providers:  SeriousBroker.it  This test is no                          t yet approved or cleared by the [place] FDA and  has been authorized for detection and/or diagnosis of SARS-CoV-2 by FDA under an Emergency Use Authorization (EUA). This EUA will remain  in effect (meaning this test can be used) for the duration of the COVID-19 declaration under Section 564(b)(1) of the Act, 21 U.S.C.section 360bbb-3(b)(1), unless the authorization is terminated  or revoked sooner.       Influenza A by PCR 01/12/2021 NEGATIVE  NEGATIVE Final   Influenza B by PCR 01/12/2021 NEGATIVE  NEGATIVE Final   Comment: (NOTE) The Xpert Xpress SARS-CoV-2/FLU/RSV plus assay is intended as an aid in the diagnosis of influenza from Nasopharyngeal swab specimens and should not be used as a sole basis for treatment. Nasal washings and aspirates are unacceptable for Xpert Xpress SARS-CoV-2/FLU/RSV testing.  Fact Sheet for Patients: BloggerCourse.com  Fact Sheet for Healthcare  Providers: SeriousBroker.it  This test is not yet approved or cleared by the [place] FDA and has been authorized for detection and/or diagnosis of SARS-CoV-2 by FDA under an Emergency Use Authorization (EUA). This EUA will remain in effect (meaning this test can be used) for the duration of the COVID-19 declaration under Section 564(b)(1)  of the Act, 21 U.S.C. section 360bbb-3(b)(1), unless the authorization is terminated or revoked.  Performed at [facility], [address] [place], [place] [address]    Sodium 01/13/2021 139  135 - 145 mmol/L Final   Potassium 01/13/2021 4.1  3.5 - 5.1 mmol/L Final   Chloride 01/13/2021 101  98 - 111 mmol/L Final   CO2 01/13/2021 32  22 - 32 mmol/L Final   Glucose, Bld 01/13/2021 113 (A) 70 - 99 mg/dL Final   Glucose reference range applies only to samples taken after fasting for at least 8 hours.   BUN 01/13/2021 16  6 - 20 mg/dL Final   Creatinine, Ser 01/13/2021 1.15  0.61 - 1.24 mg/dL Final   Calcium 42/35/3614 9.0  8.9 - 10.3 mg/dL Final   GFR, Estimated 01/13/2021 >60  >60 mL/min Final   Comment: (NOTE) Calculated using the CKD-EPI Creatinine Equation (2021)    Anion gap 01/13/2021 6  5 - 15 Final   Performed at [facility], [address] [place], [place] [address]   WBC 01/13/2021 7.2  4.0 - 10.5 K/uL Final   RBC 01/13/2021 4.73  4.22 - 5.81 MIL/uL Final   Hemoglobin 01/13/2021 14.4  13.0 - 17.0 g/dL Final   HCT 00/86/7619 42.4  39.0 - 52.0 % Final   MCV 01/13/2021 89.6  80.0 - 100.0 fL Final   MCH 01/13/2021 30.4  26.0 - 34.0 pg Final   MCHC 01/13/2021 34.0  30.0 - 36.0 g/dL Final   RDW 50/93/2671 13.1  11.5 - 15.5 % Final   Platelets 01/13/2021 240  150 - 400 K/uL Final   nRBC 01/13/2021 0.0  0.0 - 0.2 % Final   Performed at [facility], [address] [place], [place] [address]   MRSA by PCR Next Gen 01/13/2021 NOT DETECTED  NOT DETECTED  Final   Comment: (NOTE) The GeneXpert MRSA Assay (FDA approved for NASAL specimens only), is one component of a comprehensive MRSA colonization surveillance program. It is not intended to diagnose MRSA infection nor to guide or monitor treatment for MRSA infections. Test performance is not FDA approved in patients less than 58 years old. Performed at [facility], [address], [place], [place] [address]    WBC 01/14/2021 7.4  4.0 - 10.5 K/uL Final   RBC 01/14/2021 5.16  4.22 - 5.81 MIL/uL Final   Hemoglobin 01/14/2021 15.7  13.0 - 17.0 g/dL Final   HCT 82/50/5397 45.9  39.0 - 52.0 % Final   MCV 01/14/2021 89.0  80.0 - 100.0 fL Final   MCH 01/14/2021 30.4  26.0 - 34.0 pg Final   MCHC 01/14/2021 34.2  30.0 - 36.0 g/dL Final   RDW 67/34/1937 12.9  11.5 - 15.5 % Final   Platelets 01/14/2021 224  150 - 400 K/uL Final   nRBC 01/14/2021 0.0  0.0 - 0.2 % Final   Neutrophils Relative % 01/14/2021 62  % Final   Neutro Abs 01/14/2021 4.6  1.7 - 7.7 K/uL Final   Lymphocytes Relative 01/14/2021 23  % Final   Lymphs Abs 01/14/2021 1.7  0.7 - 4.0 K/uL Final   Monocytes Relative 01/14/2021 11  % Final   Monocytes Absolute 01/14/2021 0.8  0.1 - 1.0 K/uL Final   Eosinophils Relative 01/14/2021 3  % Final   Eosinophils Absolute 01/14/2021 0.2  0.0 - 0.5 K/uL Final   Basophils Relative 01/14/2021 1  % Final   Basophils Absolute  01/14/2021 0.0  0.0 - 0.1 K/uL Final   Immature Granulocytes 01/14/2021 0  % Final   Abs Immature Granulocytes 01/14/2021 0.02  0.00 - 0.07 K/uL Final   Performed at [facility], [address], [place], [place] [address]   Sodium 01/14/2021 138  135 - 145 mmol/L Final   Potassium 01/14/2021 3.6  3.5 - 5.1 mmol/L Final   Chloride 01/14/2021 102  98 - 111 mmol/L Final   CO2 01/14/2021 29  22 - 32 mmol/L Final   Glucose, Bld 01/14/2021 94  70 - 99 mg/dL Final   Glucose reference range applies only to samples taken after fasting for at  least 8 hours.   BUN 01/14/2021 13  6 - 20 mg/dL Final   Creatinine, Ser 01/14/2021 1.06  0.61 - 1.24 mg/dL Final   Calcium 03/88/8280 9.0  8.9 - 10.3 mg/dL Final   Total Protein 03/49/1791 6.3 (A) 6.5 - 8.1 g/dL Final   Albumin 50/56/9794 3.7  3.5 - 5.0 g/dL Final   AST 80/16/5537 19  15 - 41 U/L Final   ALT 01/14/2021 20  0 - 44 U/L Final   Alkaline Phosphatase 01/14/2021 43  38 - 126 U/L Final   Total Bilirubin 01/14/2021 0.8  0.3 - 1.2 mg/dL Final   GFR, Estimated 01/14/2021 >60  >60 mL/min Final   Comment: (NOTE) Calculated using the CKD-EPI Creatinine Equation (2021)    Anion gap 01/14/2021 7  5 - 15 Final   Performed at [facility], [address] [place], [place] [address]   Phosphorus 01/14/2021 2.1 (A) 2.5 - 4.6 mg/dL Final   Performed at [facility], [address], [place], [place] [address]   Magnesium 01/14/2021 1.9  1.7 - 2.4 mg/dL Final   Performed at [facility], [address] [place], [place] [address]    Allergies: Patient has no known allergies.  PTA Medications: (Not in a hospital admission)   Medical Decision Making  Patient admitted to the [facility] facility based crisis for mood stabilization and safety.   Substance use dx:  cloNIDine  0.1 mg Oral QID   Followed by   Melene Muller ON 02/20/2021] cloNIDine  0.1 mg Oral BH-qamhs   Followed by   Melene Muller ON 02/22/2021] cloNIDine  0.1 mg Oral QAC breakfast   gabapentin  100 mg Oral BID   HTN: Lisinopril 10 mg po daily and monitor b/p. Will increase to Lisinopril 20 mg po daily if  no changes in elevated b/p along with pt taking clonidine for opiate detox  protocol.    Psychotherapy-for substance abuse and depression   Disposition-ongoing-Patient is requesting residential substance abuse treatment  Recommendations  Based on my evaluation the patient does not appear to have an emergency medical condition.  Layla Barter, NP 02/17/21  5:10 PM

## 2021-02-17 NOTE — BH Assessment (Addendum)
Comprehensive Clinical Assessment (CCA) Note  02/17/2021 Erik Campbell [id number]  DISPOSITION: Gave clinical report to Nira Conn, FNP who recommended Pt be admitted to [facility] when medically cleared. Notified Theda Belfast, AC and Cecilio Asper, NP of pending transfer. Notified Dr Ross Marcus, Mia McDonald, PA-C, and Bertram Millard, RN of recommendation via secure message.  The patient demonstrates the following risk factors for suicide: Chronic risk factors for suicide include: psychiatric disorder of depressive disorder, substance use disorder, and previous suicide attempts by overdose . Acute risk factors for suicide include: family or marital conflict, unemployment, social withdrawal/isolation, and loss (financial, interpersonal, professional). Protective factors for this patient include: responsibility to others (children, family). Considering these factors, the overall suicide risk at this point appears to be high. Patient is not appropriate for outpatient follow up.  Flowsheet Row ED from 02/17/2021 in [facility] to Hosp-Admission (Discharged) from [facility]/12/2021 [facility] (Discharged[facility]  C-SSRS RISK CATEGORY High Risk No Risk Error: Q3, 4, or 5 should not be populated when Q2 is No      Pt is a 40 year old male who presents unaccompanied to [facility] ED reporting depressive symptoms, substance use, and suicidal ideation. Pt's medical record indicates he was discharged from [facility] ED a few hours ago. He states after he left he attempted to kill himself by inhaling an unknown quantity of heroin and cocaine. Pt says his addiction controls him and "I'm never going to be normal again." He says he is still suicidal. He reports he has attempted suicide in the past by overdose. Pt acknowledges symptoms including crying spells, social  withdrawal, loss of interest in usual pleasures, fatigue, irritability, decreased concentration, decreased sleep, decreased appetite and feelings of guilt, worthlessness and hopelessness. Pt denies any history of intentional self-injurious behaviors. Pt denies current homicidal ideation or history of violence. Pt denies any history of auditory or visual hallucinations.  Pt identifies consequences of his substance use as his primary stressor. He says he has used various drugs recreationally since adolescence but three years ago he had surgery and became addicted to pain medications and was unable to stop using. He says the pain medications led to use of heroin. Pt reports that he relapsed a month ago after being sober from 6 months. He states that he was in the [facility] and going to meeting at his church to help with sobriety. He reports that he stopped going to meetings, praying and going to church support group that caused him to relapse. He states he is homeless and unemployed. He says his girlfriend is pregnant and she has found out "all the lies I've told." Pt denies history of abuse. He denies legal problems. He denies access to firearms.  Pt says he has no outpatient providers. He says he is not prescribed any psychiatric medications. Pt reports he was at [facility] in March 2022. Pt's medical record indicates he was also inpatient at [facility] in March 2022.  Pt is dressed in hospital scrubs and oriented x4. He is drowsy and frequently dozes off. Pt speaks in a clear tone, at moderate volume and normal pace. Motor behavior appears normal. Eye contact is fair. Pt's mood is depressed and affect is congruent with mood. Thought process is coherent and relevant. There is no indication Pt is currently responding to internal stimuli or experiencing delusional thought content.  Pt was cooperative throughout assessment.   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 Self  Referral name: No data recorded Referral phone number: No data recorded  Whom do you see for routine medical problems? I don't have a doctor  Practice/Facility Name: No data recorded Practice/Facility Phone Number: No data recorded Name of Contact: No data recorded Contact Number: No data recorded Contact Fax Number: No data recorded Prescriber Name: No data recorded Prescriber Address (if known): No data recorded  What Is the Reason for Your Visit/Call Today? Pt has history of substance use, primarily opiates. He says he attempted to kill himself tonight by overdosing on heroin and cocaine.  How Long Has This Been Causing You Problems? > than 6 months  What Do You Feel Would Help You the Most Today? Alcohol or Drug Use Treatment; Treatment for Depression or other mood problem   Have You Recently Been in Any Inpatient Treatment (Hospital/Detox/Crisis Center/28-Day Program)? Yes  Name/Location of Program/Hospital:[facility] in [place] 3-4 months ago.  How Long Were You There? a week  When Were You Discharged? No data recorded  Have You Ever Received Services From [facility] Before? Yes  Who Do You See at [facility]? ED visits   Have You Recently Had Any Thoughts About Hurting Yourself? Yes  Are You Planning to Commit Suicide/Harm Yourself At This time? Yes   Have you Recently Had Thoughts About Hurting Someone Karolee Ohs? No  Explanation: No data recorded  Have You Used Any Alcohol or Drugs in the Past 24 Hours? Yes  How Long Ago Did You Use Drugs or Alcohol? 1830  What Did You Use and How Much? Pt reports using and unknown quantity of heroin and cocaine tonight   Do You Currently Have a  Therapist/Psychiatrist? No  Name of Therapist/Psychiatrist: No data recorded  Have You Been Recently Discharged From Any Office Practice or Programs? No  Explanation of Discharge From Practice/Program: No data recorded    CCA Screening Triage Referral Assessment Type of Contact: Tele-Assessment  Is this Initial or Reassessment? Initial Assessment  Date Telepsych consult ordered in [facility]:  02/17/21  Time Telepsych consult ordered in [facility]:  0356   Patient Reported Information Reviewed? Yes  Patient Left Without Being Seen? No data recorded Reason for Not Completing Assessment: No data recorded  Collateral Involvement: Medical record   Does Patient Have a Court Appointed Legal Guardian? No data recorded Name and Contact of Legal Guardian: No data recorded If Minor and Not Living with Parent(s), Who has Custody? NA  Is CPS involved or ever been involved? Never  Is APS involved or ever been involved? Never   Patient Determined To Be At Risk for Harm To Self or Others Based on Review of Patient Reported Information or Presenting Complaint? Yes, for Self-Harm  Method: No data recorded Availability of Means: No data recorded Intent: No data recorded Notification Required: No data recorded Additional Information for Danger to Others Potential: No data recorded Additional Comments for Danger to Others Potential: No data recorded Are There Guns or Other Weapons in Your Home? No data recorded Types of Guns/Weapons: No data recorded Are These Weapons Safely Secured?  No data recorded Who Could Verify You Are Able To Have These Secured: No data recorded Do You Have any Outstanding Charges, Pending Court Dates, Parole/Probation? No data recorded Contacted To Inform of Risk of Harm To Self or Others: Unable to Contact:   Location of Assessment: [facility] ED   Does Patient Present under Involuntary Commitment? No  IVC Papers Initial File Date: No data  recorded  [place] of Residence: [place]   Patient Currently Receiving the Following Services: Not Receiving Services   Determination of Need: No data recorded  Options For Referral: Facility-Based Crisis     CCA Biopsychosocial Intake/Chief Complaint:  Pt was d/c'ed from [facility] in [place] in October '21.  He has been staying at a [facility] for the last few months since that discharge.  Pt sys he figured out how to cheat the system.  Tonight he used up all the heroin he had (usually be tween a hald to a full gram) tonight instead of spreading it out over the course of hours.  He says it was intentional "  Iw woul dbe easier to end it all."  Pt says he has had three previous suicide attempts by overdose over the last couple of months.  Patient still endorses SI.  Patient denies any HI or A/V hallucinations.  Patient says he wants to stop.  Current Symptoms/Problems: Pt presents with SI afte he says he intentionally tried to kill himself.  He denies any HI.  He says he sees "weird shit" when he is asleep sometimes.  Not hearing voices however.   Patient Reported Schizophrenia/Schizoaffective Diagnosis in Past: No   Strengths: Pt is familiar with 12-step based recovery  Preferences: No data recorded Abilities: No data recorded  Type of Services Patient Feels are Needed: No data recorded  Initial Clinical Notes/Concerns: No data recorded  Mental Health Symptoms Depression:   Change in energy/activity; Difficulty Concentrating; Fatigue; Hopelessness; Increase/decrease in appetite; Irritability; Sleep (too much or little); Tearfulness; Weight gain/loss; Worthlessness   Duration of Depressive symptoms:  Greater than two weeks   Mania:   None   Anxiety:    Worrying; Tension; Sleep; Restlessness; Irritability; Fatigue; Difficulty concentrating   Psychosis:   None   Duration of Psychotic symptoms: No data recorded  Trauma:   None   Obsessions:   None   Compulsions:    None   Inattention:   N/A   Hyperactivity/Impulsivity:   N/A   Oppositional/Defiant Behaviors:   N/A   Emotional Irregularity:   None   Other Mood/Personality Symptoms:   NA    Mental Status Exam Appearance and self-care  Stature:   Average   Weight:   Average weight   Clothing:   -- (Scrubs)   Grooming:   Normal   Cosmetic use:   None   Posture/gait:   Normal   Motor activity:   Not Remarkable   Sensorium  Attention:   Normal (Drowsy)   Concentration:   Normal   Orientation:   X5   Recall/memory:   Normal   Affect and Mood  Affect:   Depressed; Tearful   Mood:   Depressed   Relating  Eye contact:   Normal   Facial expression:   Depressed   Attitude toward examiner:   Cooperative   Thought and Language  Speech flow:  Normal   Thought content:   Appropriate to Mood and Circumstances   Preoccupation:   None   Hallucinations:   None   Organization:  No  data recorded  [employer] of Knowledge:   Average   Intelligence:   Average   Abstraction:   Normal   Judgement:   Impaired   Reality Testing:   Adequate   Insight:   Gaps   Decision Making:   Vacilates   Social Functioning  Social Maturity:   Isolates   Social Judgement:   "Street Smart"   Stress  Stressors:   Family conflict; Housing; Work   Coping Ability:   Overwhelmed; Exhausted   Skill Deficits:   Responsibility   Supports:   Family     Religion: Religion/Spirituality Are You A Religious Person?: Yes What is Your Religious Affiliation?: Christian How Might This Affect Treatment?: NA  Leisure/Recreation: Leisure / Recreation Do You Have Hobbies?: Yes Leisure and Hobbies: Watching sports  Exercise/Diet: Exercise/Diet Do You Exercise?: No Have You Gained or Lost A Significant Amount of Weight in the Past Six Months?: Yes-Lost Number of Pounds Lost?: 5 Do You Follow a Special Diet?: No Do You Have Any Trouble  Sleeping?: Yes Explanation of Sleeping Difficulties: Pt describes his sleep as horrible   CCA Employment/Education Employment/Work Situation: Employment / Work Situation Employment Situation: Unemployed Patient's Job has Been Impacted by Current Illness: Yes Describe how Patient's Job has Been Impacted: "I cant work because I cant stop taking the drugs" Has Patient ever Been in the [employer]?: No  Education: Education Is Patient Currently Attending School?: No   CCA Family/Childhood History Family and Relationship History: Family history Marital status: Single Does patient have children?: Yes How many children?: 1 How is patient's relationship with their children?: "I have a 26 year old son and we get along alright"  Childhood History:  Childhood History By whom was/is the patient raised?: Mother Did patient suffer any verbal/emotional/physical/sexual abuse as a child?: No Did patient suffer from severe childhood neglect?: No Has patient ever been sexually abused/assaulted/raped as an adolescent or adult?: No Was the patient ever a victim of a crime or a disaster?: No Witnessed domestic violence?: No Has patient been affected by domestic violence as an adult?: No  Child/Adolescent Assessment:     CCA Substance Use Alcohol/Drug Use: Alcohol / Drug Use Pain Medications: Pt has history of abusing narcotics Prescriptions: Pt has history of abusing pain medications Over the Counter: Denies abuse History of alcohol / drug use?: Yes Longest period of sobriety (when/how long): 90 days Negative Consequences of Use: Legal, Work / Programmer, multimedia, Copywriter, advertising relationships, Surveyor, quantity Withdrawal Symptoms: Sweats, Irritability, Tremors, Weakness, Cramps, Patient aware of relationship between substance abuse and physical/medical complications, Fever / Chills Substance #1 Name of Substance 1: Heroin 1 - Age of First Use: 37 1 - Amount (size/oz): Approximately 0.5 grams 1 - Frequency:  Daily 1 - Duration: 3 years 1 - Last Use / Amount: 02/17/2021 1 - Method of Aquiring: Dealer 1- Route of Use: Inhaling Substance #2 Name of Substance 2: Cocaine 2 - Age of First Use: 40 years of age 67 - Amount (size/oz): Varies 2 - Frequency: 2-3 times per month 2 - Duration: Ongoing 2 - Last Use / Amount: 02/17/2021 2 - Method of Aquiring: Dealer 2 - Route of Substance Use: Inhaling                     ASAM's:  Six Dimensions of Multidimensional Assessment  Dimension 1:  Acute Intoxication and/or Withdrawal Potential:   Dimension 1:  Description of individual's past and current experiences of substance use and withdrawal: Pt  says that this evening he had overdosed to kill himself so that he would not be suffering from addiction anymore.  Dimension 2:  Biomedical Conditions and Complications:   Dimension 2:  Description of patient's biomedical conditions and  complications: HTN  Dimension 3:  Emotional, Behavioral, or Cognitive Conditions and Complications:  Dimension 3:  Description of emotional, behavioral, or cognitive conditions and complications: Pt reports symptoms of depression  Dimension 4:  Readiness to Change:  Dimension 4:  Description of Readiness to Change criteria: Pt says he is motivated to stop using  Dimension 5:  Relapse, Continued use, or Continued Problem Potential:  Dimension 5:  Relapse, continued use, or continued problem potential critiera description: Pt has frequent relapses  Dimension 6:  Recovery/Living Environment:  Dimension 6:  Recovery/Iiving environment criteria description: Homeless  ASAM Severity Score: ASAM's Severity Rating Score: 15  ASAM Recommended Level of Treatment:     Substance use Disorder (SUD) Substance Use Disorder (SUD)  Checklist Symptoms of Substance Use: Continued use despite persistent or recurrent social, interpersonal problems, caused or exacerbated by use, Evidence of withdrawal (Comment), Evidence of tolerance, Continued  use despite having a persistent/recurrent physical/psychological problem caused/exacerbated by use, Recurrent use that results in a failure to fulfill major role obligations (work, school, home), Social, occupational, recreational activities given up or reduced due to use  Recommendations for Services/Supports/Treatments: Recommendations for Services/Supports/Treatments Recommendations For Services/Supports/Treatments: Medication Management, Detox, Individual Therapy, Inpatient Hospitalization  DSM5 Diagnoses: Patient Active Problem List   Diagnosis Date Noted   Polysubstance overdose 01/12/2021   Acute respiratory failure with hypoxia (HCC) 01/12/2021   AKI (acute kidney injury) (HCC) 01/12/2021   Heroin abuse (HCC) 07/23/2020   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    Patient Centered Plan: Patient is on the following Treatment Plan(s):  Depression and Substance Abuse   Referrals to Alternative Service(s): Referred to Alternative Service(s):   Place:   Date:   Time:    Referred to Alternative Service(s):   Place:   Date:   Time:    Referred to Alternative Service(s):   Place:   Date:   Time:    Referred to Alternative Service(s):   Place:   Date:   Time:     Pamalee Leyden, [facility]

## 2021-02-17 NOTE — ED Notes (Signed)
Patient up with no sxs of distress - Denies SI, HI, AVH - on the phone and obviously angry (yelling at person on the other end of the line) - will continue to monitor for safety

## 2021-02-17 NOTE — ED Notes (Signed)
TTS completed. Nira Conn, FNP recommends Pt be transferred to Facility Based Crisis [facility]) at [facility] after Pt is medically cleared.

## 2021-02-17 NOTE — ED Notes (Signed)
Safe transport called at [phone] to transport patient to [facility].

## 2021-02-17 NOTE — ED Triage Notes (Signed)
Pt arrived via POV, states SI with attempt to OD on cocaine x2 hours PTA. Denies HI. States no specific trigger. Pt spo2 down to 85% in triage, maintained for approx 2 minutes, spo2 back up to 96% RA, sustained, denies any SOB. Aox4 in triage.

## 2021-02-17 NOTE — ED Provider Notes (Signed)
Emergency Medicine Observation Re-evaluation Note  Erik Campbell is a 40 y.o. male, seen on rounds today.  Pt initially presented to the ED for complaints of Suicide Attempt Currently, the patient is transferred to behavioral health Hospital.  Patient reports he feels fine.  He is not having chest pain or shortness of breath.  He does not feel lightheaded with walking.  Patient reports his normal heart rate is in the 50s.  Physical Exam  BP 123/63   Pulse (!) 58   Temp 98.4 F (36.9 C) (Oral)   Resp 14   SpO2 100%  Physical Exam General: Alert with clear mental status no respiratory distress Cardiac: Regular no rub murmur gallop Lungs: Clear to auscultation Psych: Situationally appropriate.  No signs of psychosis or acute confusion.  ED Course / MDM  EKG:EKG Interpretation  Date/Time:  Saturday February 17 2021 04:16:53 EDT Ventricular Rate:  53 PR Interval:  179 QRS Duration: 137 QT Interval:  445 QTC Calculation: 418 R Axis:   44 Text Interpretation: Sinus rhythm Nonspecific intraventricular conduction delay no significant interval change from previous Confirmed by Arby Barrette [phone]) on 02/17/2021 2:38:37 PM  I have reviewed the labs performed to date as well as medications administered while in observation.  Recent changes in the last 24 hours include patient is excepted to behavioral health Hospital.  Patient is EKG is stable from previous.  He is not symptomatic with any bradycardia.  Observing monitor, rhythm is regular and in the 50s.  Patient reports this is his baseline.  Patient is medically cleared for psychiatric treatment.  Plan  Current plan is for inpatient treatment for intentional overdose on opioids.  Erik Campbell is not under involuntary commitment.     Arby Barrette, MD 02/17/21 [phone]

## 2021-02-17 NOTE — ED Notes (Signed)
Pt changed into purple scrubs. Pt belongings placed in bags and put in nurses station 16-22 cabinet. Pt has total of 3 bags in cabinet.

## 2021-02-17 NOTE — ED Provider Notes (Signed)
[facility]-EMERGENCY DEPT Provider Note   CSN: [id number] Arrival date & time: 02/17/21  0334     History Chief Complaint  Patient presents with   Suicide Attempt    Erik Campbell is a 40 y.o. male with a history of opioid use disorder, cocaine use disorder, recurrent major depressive disorder, HTN, and previous suicide attempt who presents the emergency department with a chief complaint of suicide attempt.  The patient reports that he attempted to overdose on opiates as a suicide attempt earlier tonight.  Last use was approximately 2 hours prior to arrival.  He is unsure of what type of opioids he has been using.  He also reports crack cocaine use.  No IV drug use.  No other illicit or recreational substance use.  Reports that he has been feeling more suicidal recently about relapsing with drug use about 1 month ago.  He denies denies alcohol use.  No HI or auditory visual hallucinations.  Triage reports that the patient had a brief episode of hypoxia with oxygen saturation in 85% in triage for approximately 2 minutes before returning to 96% where it was then sustained.  He denied any shortness of breath.  He has no other complaints at this time including chest pain, shortness of breath, headache, dizziness, lightheadedness, vomiting, diarrhea.  The history is provided by the patient and medical records. No language interpreter was used.      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 No pertinent surgical history.     History reviewed. No pertinent family history.  Social History   Tobacco Use   Smoking status: Former   Smokeless tobacco: Never  Building services engineer Use: Never used  Substance Use Topics   Alcohol use: Not Currently    Comment: not in the past 30 days    Drug use: Not Currently    Types: "Crack" cocaine, Heroin    Comment: not in the past 30 days     Home Medications Prior to Admission medications   Medication Sig Start Date End Date Taking? Authorizing Provider  lisinopril (ZESTRIL) 20 MG tablet Take 1 tablet (20 mg total) by mouth daily. 01/15/21  Yes Marguerita Merles Latif, DO    Allergies    Patient has no known allergies.  Review of Systems   Review of Systems  Constitutional:  Negative for appetite change, chills, fatigue and fever.  HENT:  Negative for congestion and sore throat.   Eyes:  Negative for visual disturbance.  Respiratory:  Negative for cough, shortness of breath and wheezing.   Cardiovascular:  Negative for chest pain.  Gastrointestinal:  Negative for abdominal pain, diarrhea, nausea and vomiting.  Genitourinary:  Negative for dysuria.  Musculoskeletal:  Negative for back pain.  Skin:  Negative for rash.  Neurological:  Negative for headaches.  Psychiatric/Behavioral:  Positive for suicidal ideas. Negative for agitation, confusion, decreased concentration, dysphoric mood and hallucinations. The patient is not nervous/anxious and is not hyperactive.  Physical Exam Updated Vital Signs BP 118/81   Pulse (!) 58   Temp 98.2 F (36.8 C) (Oral)   Resp (!) 9   SpO2 95%   Physical Exam Vitals and nursing note reviewed.  Constitutional:      Appearance: He is well-developed.  HENT:     Head: Normocephalic.  Eyes:     Conjunctiva/sclera: Conjunctivae normal.     Comments: Pupils are small bilaterally and reactive to light, but not pinpoint.  Cardiovascular:      Rate and Rhythm: Normal rate and regular rhythm.     Pulses: Normal pulses.     Heart sounds: Normal heart sounds. No murmur heard.   No friction rub. No gallop.  Pulmonary:     Effort: Pulmonary effort is normal. No respiratory distress.     Breath sounds: No stridor. No wheezing, rhonchi or rales.  Chest:     Chest wall: No tenderness.  Abdominal:     General: There is no distension.     Palpations: Abdomen is soft. There is no mass.     Tenderness: There is no abdominal tenderness. There is no right CVA tenderness, left CVA tenderness, guarding or rebound.     Hernia: No hernia is present.  Musculoskeletal:     Cervical back: Neck supple.  Skin:    General: Skin is warm and dry.     Coloration: Skin is not jaundiced.  Neurological:     General: No focal deficit present.     Mental Status: He is alert.  Psychiatric:        Attention and Perception: He does not perceive auditory or visual hallucinations.        Mood and Affect: Affect is flat.        Speech: Speech normal.        Behavior: Behavior normal. Behavior is cooperative.        Thought Content: Thought content includes suicidal ideation. Thought content does not include homicidal ideation. Thought content includes suicidal plan. Thought content does not include homicidal plan.    ED Results / Procedures / Treatments   Labs (all labs ordered are listed, but only abnormal results are displayed) Labs Reviewed  COMPREHENSIVE METABOLIC PANEL - Abnormal; Notable for the following components:      Result Value   BUN 23 (*)    Total Protein 6.4 (*)    All other components within normal limits  ACETAMINOPHEN LEVEL - Abnormal; Notable for the following components:   Acetaminophen (Tylenol), Serum <10 (*)    All other components within normal limits  SALICYLATE LEVEL - Abnormal; Notable for the following components:   Salicylate Lvl <7.0 (*)    All other components within normal limits  RESP PANEL BY RT-PCR (FLU A&B,  COVID) ARPGX2  ETHANOL  CBC WITH DIFFERENTIAL/PLATELET  RAPID URINE DRUG SCREEN, HOSP PERFORMED    EKG None  Radiology No results found.  Procedures Procedures   Medications Ordered in ED Medications - No data to display  ED Course  I have reviewed the triage vital signs and the nursing notes.  Pertinent labs & imaging results that were available during my care of the patient were reviewed by me and considered in my medical decision making (see chart for details).    MDM Rules/Calculators/A&P                           40 year old male with a history of opioid use  disorder, cocaine use disorder, recurrent major depressive disorder, HTN, and previous suicide attempt who presents to the emergency department with a chief complaint of suicide attempt by intentional opioid overdose just prior to arrival.  He also endorses crack cocaine use earlier tonight.  No IV drug use.  Triage RN reported that patient had a brief episode of hypoxia in triage for 2 minutes that then resolved.  He denies HI or auditory visualizations.  He has no other complaints at this time.  Labs and imaging of been reviewed and independently interpreted by me.  No metabolic derangements.  CBC is unremarkable.  Tylenol, salicylate, and alcohol levels are not elevated.  COVID-19 test is negative.  Reevaluation, he has had no recurrent episodes of sustained hypoxia.  Although pupils were small on arrival, they were not pinpoint.  No Narcan was administered.  At this time, the patient is medically cleared.  TTS was consulted and transfer to [facility] was recommended by Nira Conn, FNP.  He is pending EMTALA.  Discussed with patient if he is agreeable with plan.  However, patient attempts to leave prior to transfer he will require IVC.  Discussed this with PA Loeffler at time of shift change since transportation is pending.  Final Clinical Impression(s) / ED Diagnoses Final diagnoses:  Suicidal ideation  Intentional  overdose, initial encounter (HCC)  Opioid use disorder  Cocaine use disorder [facility])    Rx / DC Orders ED Discharge Orders     None        Barkley Boards, PA-C 02/17/21 0757    Shon Baton, MD 02/17/21 2313

## 2021-02-17 NOTE — ED Notes (Signed)
ED TO INPATIENT HANDOFF REPORT  ED Nurse Name and Phone #: [phone]  S Name/Age/Gender Erik Campbell 40 y.o. male Room/Bed: WA13/WA13  Code Status   Code Status: Prior  Home/SNF/Other [facility] Patient oriented to: self, place, time, and situation Is this baseline? Yes   Triage Complete: Triage complete  Chief Complaint suicide attempt  Triage Note Pt arrived via POV, states SI with attempt to OD on cocaine x2 hours PTA. Denies HI. States no specific trigger. Pt spo2 down to 85% in triage, maintained for approx 2 minutes, spo2 back up to 96% RA, sustained, denies any SOB. Aox4 in triage.     Allergies No Known Allergies  Level of Care/Admitting Diagnosis ED Disposition     ED Disposition  Transfer to [facility]   Condition  --   Comment  Patient has been medically cleared and is in stable condition, and is being transferred to the [facility].          B Medical/Surgery History Past Medical History:  Diagnosis Date   Hypertension    History reviewed. No pertinent surgical history.   A IV Location/Drains/Wounds Patient Lines/Drains/Airways Status     Active Line/Drains/Airways     None            Intake/Output Last 24 hours No intake or output data in the 24 hours ending 02/17/21 1420  Labs/Imaging Results for orders placed or performed during the hospital encounter of 02/17/21 (from the past 48 hour(s))  Resp Panel by RT-PCR (Flu A&B, Covid) Nasopharyngeal Swab     Status: None   Collection Time: 02/17/21  4:37 AM   Specimen: Nasopharyngeal Swab; Nasopharyngeal(NP) swabs in vial transport medium  Result Value Ref Range   SARS Coronavirus 2 by RT PCR NEGATIVE NEGATIVE    Comment: (NOTE) SARS-CoV-2 target nucleic acids are NOT DETECTED.  The SARS-CoV-2 RNA is generally detectable in upper respiratory specimens during the acute phase of infection. The lowest concentration of SARS-CoV-2 viral copies this  assay can detect is 138 copies/mL. A negative result does not preclude SARS-Cov-2 infection and should not be used as the sole basis for treatment or other patient management decisions. A negative result may occur with  improper specimen collection/handling, submission of specimen other than nasopharyngeal swab, presence of viral mutation(s) within the areas targeted by this assay, and inadequate number of viral copies(<138 copies/mL). A negative result must be combined with clinical observations, patient history, and epidemiological information. The expected result is Negative.  Fact Sheet for Patients:  BloggerCourse.com  Fact Sheet for Healthcare Providers:  SeriousBroker.it  This test is no t yet approved or cleared by the [place] FDA and  has been authorized for detection and/or diagnosis of SARS-CoV-2 by FDA under an Emergency Use Authorization (EUA). This EUA will remain  in effect (meaning this test can be used) for the duration of the COVID-19 declaration under Section 564(b)(1) of the Act, 21 U.S.C.section 360bbb-3(b)(1), unless the authorization is terminated  or revoked sooner.       Influenza A by PCR NEGATIVE NEGATIVE   Influenza B by PCR NEGATIVE NEGATIVE    Comment: (NOTE) The Xpert Xpress SARS-CoV-2/FLU/RSV plus assay is intended as an aid in the diagnosis of influenza from Nasopharyngeal swab specimens and should not be used as a sole basis for treatment. Nasal washings and aspirates are unacceptable for Xpert Xpress SARS-CoV-2/FLU/RSV testing.  Fact Sheet for Patients: BloggerCourse.com  Fact Sheet for Healthcare Providers: SeriousBroker.it  This test is not yet approved or cleared by the [place] and has been authorized for detection and/or diagnosis of SARS-CoV-2 by FDA under an Emergency Use Authorization (EUA). This EUA will remain in  effect (meaning this test can be used) for the duration of the COVID-19 declaration under Section 564(b)(1) of the Act, 21 U.S.C. section 360bbb-3(b)(1), unless the authorization is terminated or revoked.  Performed at [facility], [address], [place], [place] [address]   Comprehensive metabolic panel     Status: Abnormal   Collection Time: 02/17/21  4:37 AM  Result Value Ref Range   Sodium 138 135 - 145 mmol/L   Potassium 3.8 3.5 - 5.1 mmol/L   Chloride 101 98 - 111 mmol/L   CO2 28 22 - 32 mmol/L   Glucose, Bld 97 70 - 99 mg/dL    Comment: Glucose reference range applies only to samples taken after fasting for at least 8 hours.   BUN 23 (H) 6 - 20 mg/dL   Creatinine, Ser 3.53 0.61 - 1.24 mg/dL   Calcium 9.1 8.9 - 61.4 mg/dL   Total Protein 6.4 (L) 6.5 - 8.1 g/dL   Albumin 3.9 3.5 - 5.0 g/dL   AST 22 15 - 41 U/L   ALT 16 0 - 44 U/L   Alkaline Phosphatase 46 38 - 126 U/L   Total Bilirubin 0.6 0.3 - 1.2 mg/dL   GFR, Estimated >43 >15 mL/min    Comment: (NOTE) Calculated using the CKD-EPI Creatinine Equation (2021)    Anion gap 9 5 - 15    Comment: Performed at [facility], [address], [place], [place] [address]  Ethanol     Status: None   Collection Time: 02/17/21  4:37 AM  Result Value Ref Range   Alcohol, Ethyl (B) <10 <10 mg/dL    Comment: (NOTE) Lowest detectable limit for serum alcohol is 10 mg/dL.  For medical purposes only. Performed at [facility], [address], [place], [place] [address]   CBC with Diff     Status: None   Collection Time: 02/17/21  4:37 AM  Result Value Ref Range   WBC 5.7 4.0 - 10.5 K/uL   RBC 4.86 4.22 - 5.81 MIL/uL   Hemoglobin 14.6 13.0 - 17.0 g/dL   HCT 09.3 26.7 - 12.4 %   MCV 88.5 80.0 - 100.0 fL   MCH 30.0 26.0 - 34.0 pg   MCHC 34.0 30.0 - 36.0 g/dL   RDW 58.0 99.8 - 33.8 %   Platelets 199 150 - 400 K/uL   nRBC 0.0 0.0 - 0.2 %   Neutrophils Relative % 46 %    Neutro Abs 2.7 1.7 - 7.7 K/uL   Lymphocytes Relative 41 %   Lymphs Abs 2.3 0.7 - 4.0 K/uL   Monocytes Relative 10 %   Monocytes Absolute 0.6 0.1 - 1.0 K/uL   Eosinophils Relative 2 %   Eosinophils Absolute 0.1 0.0 - 0.5 K/uL   Basophils Relative 0 %   Basophils Absolute 0.0 0.0 - 0.1 K/uL   Immature Granulocytes 1 %   Abs Immature Granulocytes 0.04 0.00 - 0.07 K/uL    Comment: Performed at [facility], [address], [place], [place] [address]  Acetaminophen level     Status: Abnormal   Collection Time: 02/17/21  4:37 AM  Result Value Ref Range   Acetaminophen (Tylenol), Serum <10 (L) 10 - 30 ug/mL  Comment: (NOTE) Therapeutic concentrations vary significantly. A range of 10-30 ug/mL  may be an effective concentration for many patients. However, some  are best treated at concentrations outside of this range. Acetaminophen concentrations >150 ug/mL at 4 hours after ingestion  and >50 ug/mL at 12 hours after ingestion are often associated with  toxic reactions.  Performed at [facility], [address] [place], [place] [address]   Salicylate level     Status: Abnormal   Collection Time: 02/17/21  4:37 AM  Result Value Ref Range   Salicylate Lvl <7.0 (L) 7.0 - 30.0 mg/dL    Comment: Performed at [facility], [address] [place], [place] [address]   No results found.  Pending Labs Unresulted Labs (From admission, onward)     Start     Ordered   02/17/21 0355  Urine rapid drug screen (hosp performed)  ONCE - STAT,   STAT        02/17/21 0356            Vitals/Pain Today's Vitals   02/17/21 1129 02/17/21 1340 02/17/21 1403 02/17/21 1417  BP: 135/65  (!) 174/92 123/63  Pulse: (!) 51 (!) 58 (!) 54 (!) 58  Resp: 16 (!) 24 14 14   Temp:    98.4 F (36.9 C)  TempSrc:    Oral  SpO2: 99% 100% 100% 100%  PainSc:        Isolation Precautions No active isolations  Medications Medications - No data to  display  Mobility walks Low fall risk   Focused Assessments BH   R Recommendations: See Admitting Provider Note  Report given to:   Additional Notes: n/a

## 2021-02-17 NOTE — Progress Notes (Signed)
Pt has elevated BP 145/101 and HR 53. Rodell Perna, NP notified via secure chat. Will continue to monitor.

## 2021-02-17 NOTE — ED Notes (Signed)
Patient was admitted to [facility] from [facility]. Patient is alert and oriented x 3. Patient signed his admission paperwork. Patient was oriented to the unit. Patient was given something to eat and drink. Patient denies SI/HI and AVH. Patient was given something to eat and drink. Patient has been in the dayroom watching television. Patient is being monitored by staff.

## 2021-02-17 NOTE — ED Notes (Signed)
Patient is having TTS consult now.

## 2021-02-18 DIAGNOSIS — Z79899 Other long term (current) drug therapy: Secondary | ICD-10-CM | POA: Diagnosis not present

## 2021-02-18 DIAGNOSIS — Z87891 Personal history of nicotine dependence: Secondary | ICD-10-CM | POA: Diagnosis not present

## 2021-02-18 DIAGNOSIS — Z9151 Personal history of suicidal behavior: Secondary | ICD-10-CM | POA: Diagnosis not present

## 2021-02-18 DIAGNOSIS — F1994 Other psychoactive substance use, unspecified with psychoactive substance-induced mood disorder: Secondary | ICD-10-CM | POA: Diagnosis not present

## 2021-02-18 MED ORDER — LISINOPRIL 20 MG PO TABS
20.0000 mg | ORAL_TABLET | Freq: Every day | ORAL | Status: DC
  Filled 2021-02-18: qty 3

## 2021-02-18 NOTE — ED Provider Notes (Signed)
FBC/OBS ASAP Discharge Summary  Date and Time: 02/18/2021 11:53 AM  Name: Erik Campbell  MRN:  [id number]   Discharge Diagnoses:  Final diagnoses:  Substance induced mood disorder [facility])    Subjective: Patient presented to the emergency department with a chief complaint of suicide attempt.  Stay Summary: Patient seen and reevaluated face-to-face by this provider and chart reviewed. Case discussed with Dr. Lenore Cordia. On evaluation, he is alert and oriented x4. His thought process is logical and speech is coherent. His mood is euthymic and affect is congruent. He has reported to the nursing staff multiple times this morning that he wants to be discharged. He asked this provider if we can transport him to a facility in [place] for substance abuse treatment. He was advised that he will need to speak with the social worker in the morning who can assist him with substance abuse treatment options. He states that his car is at the hospital and he could drive himself to [place] for treatment. He denies having thoughts of wanting to hurt himself or others. He denies hearing voices or seeing things that other people cannot hear or see. He does not appear to responding to internal or external stimuli. He reports fair sleep, and states that he slept 8 hours. He reports having a good appetite. He denies opiate withdrawal symptoms and no obvious symptoms noted. He denies depressive and anxiety symptoms.  He was encouraged to stay another night at the [facility] and speak with the social worker tomorrow for residential treatment options. He declines staying another night at the [facility] and declines outpatient resources. He states he will attempt to get into the substance abuse facility in [place]. Per nursing notes, patient has denied SI/HI/AVH. No disruptive or aggressive behaviors noted on the unit.  Total Time spent with patient: 15 minutes  Past Psychiatric History: hx of cocaine use dx, opiate use dx and MDD Past  Medical History:  Past Medical History:  Diagnosis Date   Hypertension    No past surgical history on file. Family History: No family history on file. Family Psychiatric History:  Social History:  Social History   Substance and Sexual Activity  Alcohol Use Not Currently   Comment: not in the past 30 days      Social History   Substance and Sexual Activity  Drug Use Not Currently   Types: "Crack" cocaine, Heroin   Comment: not in the past 30 days     Social History   Socioeconomic History   Marital status: Single    Spouse name: Not on file   Number of children: Not on file   Years of education: Not on file   Highest education level: Not on file  Occupational History   Not on file  Tobacco Use   Smoking status: Former   Smokeless tobacco: Never  Vaping Use   Vaping Use: Never used  Substance and Sexual Activity   Alcohol use: Not Currently    Comment: not in the past 30 days    Drug use: Not Currently    Types: "Crack" cocaine, Heroin    Comment: not in the past 30 days    Sexual activity: Not Currently    Comment: not in the past 30 days   Other Topics Concern   Not on file  Social History Narrative   Not on file   Social Determinants of Health   Financial Resource Strain: Not on file  Food Insecurity: Not on file  Transportation Needs:  Not on file  Physical Activity: Not on file  Stress: Not on file  Social Connections: Not on file   SDOH:  SDOH Screenings   Alcohol Screen: Low Risk    Last Alcohol Screening Score (AUDIT): 1  Depression (PHQ2-9): Low Risk    PHQ-2 Score: 0  Financial Resource Strain: Not on file  Food Insecurity: Not on file  Housing: Not on file  Physical Activity: Not on file  Social Connections: Not on file  Stress: Not on file  Tobacco Use: Medium Risk   Smoking Tobacco Use: Former   Smokeless Tobacco Use: Never  Transportation Needs: Not on file    Tobacco Cessation:  Prescription not provided because: pt declines    Current Medications:  Current Facility-Administered Medications  Medication Dose Route Frequency Provider Last Rate Last Admin   acetaminophen (TYLENOL) tablet 650 mg  650 mg Oral Q6H PRN Jeslie Lowe L, NP   650 mg at 02/17/21 2026   alum & mag hydroxide-simeth (MAALOX/MYLANTA) 200-200-20 MG/5ML suspension 30 mL  30 mL Oral Q4H PRN Enyla Lisbon L, NP       dicyclomine (BENTYL) tablet 20 mg  20 mg Oral Q6H PRN Cayetano Mikita L, NP       gabapentin (NEURONTIN) capsule 100 mg  100 mg Oral BID Talmadge Ganas L, NP   100 mg at 02/18/21 1006   hydrOXYzine (ATARAX/VISTARIL) tablet 25 mg  25 mg Oral TID PRN Jakolby Sedivy L, NP       lisinopril (ZESTRIL) tablet 10 mg  10 mg Oral Daily Nicky Kras L, NP       loperamide (IMODIUM) capsule 2-4 mg  2-4 mg Oral PRN Argentina Kosch L, NP       magnesium hydroxide (MILK OF MAGNESIA) suspension 30 mL  30 mL Oral Daily PRN Julianah Marciel L, NP       methocarbamol (ROBAXIN) tablet 500 mg  500 mg Oral Q8H PRN Rochelle Nephew L, NP       naproxen (NAPROSYN) tablet 500 mg  500 mg Oral BID PRN Kallyn Demarcus L, NP       ondansetron (ZOFRAN-ODT) disintegrating tablet 4 mg  4 mg Oral Q6H PRN Jenniferlynn Saad L, NP       traZODone (DESYREL) tablet 50 mg  50 mg Oral QHS PRN Tatiyana Foucher L, NP       Current Outpatient Medications  Medication Sig Dispense Refill   lisinopril (ZESTRIL) 20 MG tablet Take 1 tablet (20 mg total) by mouth daily. 30 tablet 0    PTA Medications: (Not in a hospital admission)   Musculoskeletal  Strength & Muscle Tone: within normal limits Gait & Station: normal Patient leans: N/A  Psychiatric Specialty Exam  Presentation  General Appearance: Appropriate for Environment  Eye Contact:Fair  Speech:Clear and Coherent  Speech Volume:Normal  Handedness: No data recorded  Mood and Affect  Mood:Euthymic  Affect:Congruent   Thought Process  Thought Processes:Coherent; Goal Directed  Descriptions of  Associations:Intact  Orientation:Full (Time, Place and Person)  Thought Content:Logical  Diagnosis of Schizophrenia or Schizoaffective disorder in past: No    Hallucinations:Hallucinations: None  Ideas of Reference:None  Suicidal Thoughts:Suicidal Thoughts: No  Homicidal Thoughts:Homicidal Thoughts: No   Sensorium  Memory:Immediate Fair; Recent Fair; Remote Fair  Judgment:Fair  Insight:Fair   Executive Functions  Concentration:Fair  Attention Span:Fair  Recall:Fair  Fund of Knowledge:Fair  Language:Fair   Psychomotor Activity  Psychomotor Activity:Psychomotor Activity: Normal   Assets  Assets:Communication Skills; Desire for Improvement; Leisure Time;  Financial Resources/Insurance   Sleep  Sleep:Sleep: Fair Number of Hours of Sleep: 8   No data recorded  Physical Exam  Physical Exam Constitutional:      Appearance: Normal appearance.  HENT:     Head: Atraumatic.     Nose: Nose normal.  Eyes:     Conjunctiva/sclera: Conjunctivae normal.  Cardiovascular:     Rate and Rhythm: Normal rate.  Pulmonary:     Effort: Pulmonary effort is normal.  Musculoskeletal:        General: Normal range of motion.     Cervical back: Normal range of motion.  Neurological:     Mental Status: He is alert and oriented to person, place, and time.   Review of Systems  Constitutional: Negative.   HENT: Negative.    Eyes: Negative.   Respiratory: Negative.    Cardiovascular: Negative.   Gastrointestinal: Negative.   Genitourinary: Negative.   Musculoskeletal: Negative.   Skin: Negative.   Neurological: Negative.   Endo/Heme/Allergies: Negative.   Psychiatric/Behavioral:  Positive for substance abuse.   Blood pressure 140/82, pulse (!) 48, temperature (!) 97.3 F (36.3 C), temperature source Temporal, resp. rate 16, SpO2 99 %. There is no height or weight on file to calculate BMI.  Demographic Factors:  Male, Caucasian, and Low socioeconomic status  Loss  Factors: Financial problems/change in socioeconomic status  Historical Factors: Impulsivity  Risk Reduction Factors:   Religious beliefs about death  Continued Clinical Symptoms:  Alcohol/Substance Abuse/Dependencies  Cognitive Features That Contribute To Risk:  None    Suicide Risk:  Minimal: No identifiable suicidal ideation.  Patients presenting with no risk factors but with morbid ruminations; may be classified as minimal risk based on the severity of the depressive symptoms  Plan Of Care/Follow-up recommendations:  Activity:  as tolerated  Patients declines follow up resources for substance abuse tx. Will add substance abuse resources to AVS.  Disposition: Discharge to home   Layla Barter, NP 02/18/2021, 11:53 AM

## 2021-02-18 NOTE — ED Notes (Signed)
Provider notified of HR 44 - no new orders

## 2021-02-18 NOTE — ED Notes (Signed)
Pt resting at this time - no distress noted - will continue to monitor for safety

## 2021-02-18 NOTE — ED Notes (Signed)
Patient A&O x 4, ambulatory. Patient discharged in no acute distress. Patient denied SI/HI, A/VH or cravings for opioids upon discharge. Patient verbalized understanding of all discharge instructions explained by staff, to include follow up appointments, RX's and safety plan. Pt belongings returned to patient from locker #29 intact. Patient escorted to [place] via staff for transport to [place] parking lot to retrieve his car via safe transport. Safety maintained.

## 2021-02-18 NOTE — Discharge Instructions (Addendum)

## 2021-02-18 NOTE — Progress Notes (Signed)
Patient up to bathroom.  Prompted to put shirt on when coming out of room. Continue to monitor for safety.

## 2021-02-18 NOTE — Progress Notes (Signed)
Patient up to bathroom.  BP and pulse rechecked per Rodell Perna, NP request.  Lisinopril held this AM per Provider.

## 2021-02-18 NOTE — ED Notes (Signed)
Patient resting with no sxs of distress - will continue to monitor for safety 

## 2021-02-26 ENCOUNTER — Telehealth (HOSPITAL_COMMUNITY): Payer: Self-pay

## 2021-02-26 NOTE — BH Assessment (Signed)
Care Management - Follow Up [facility] Discharges   Writer attempted to make contact with patient today and was unsuccessful.  Writer left a HIPPA compliant voice message.   

## 2021-06-19 LAB — FENTANYL AND MTB CONFIRM CORD
Fentanyl: 19.7 ng/mL — ABNORMAL HIGH (ref 0.3–1.5)
Norfentanyl: 7.6 ng/mL

## 2022-07-06 IMAGING — CT CT HEAD W/O CM
3 series · 15 of 47 positions shown, 18 images · non-contrast
Comparison: No pertinent prior exams available for comparison.

CLINICAL DATA: Facial trauma. Additional history provided: Hearing
loss, lump on back of head

EXAM:
CT HEAD WITHOUT CONTRAST
TECHNIQUE: Contiguous axial images were obtained from the base of the skull
through the vertex without intravenous contrast.

[Series 3: head 5.0 h30s · axial · 0.44mm/px · z∈[-87,+58]mm · 9 of 35 slices shown, 12 images]
[im 3/35  brain]
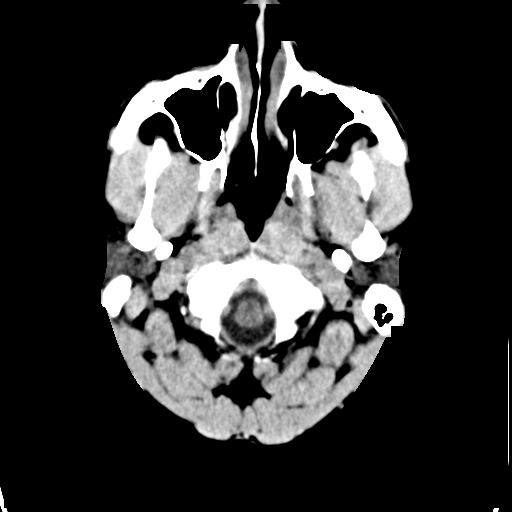
[im 3/35  bone]
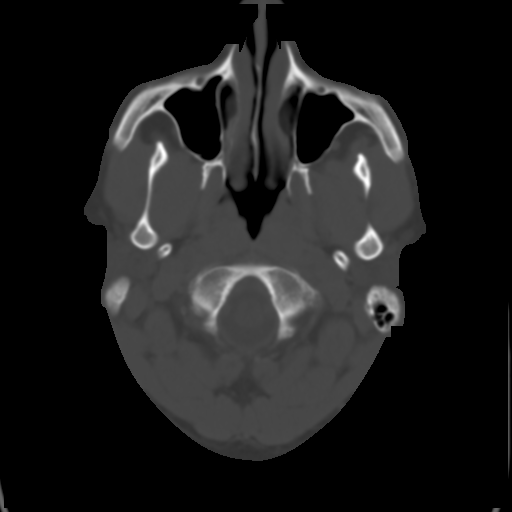
[im 6/35  brain]
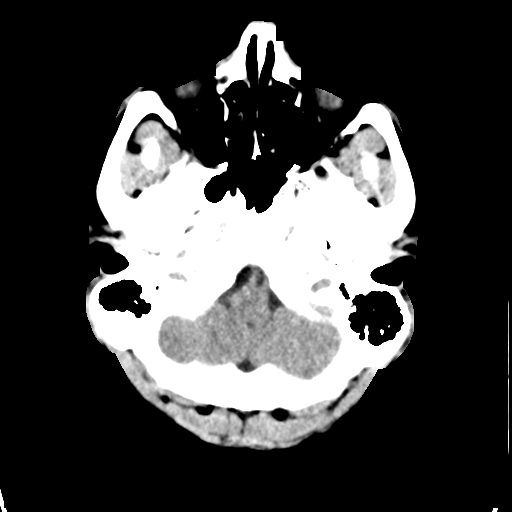
[im 10/35  brain]
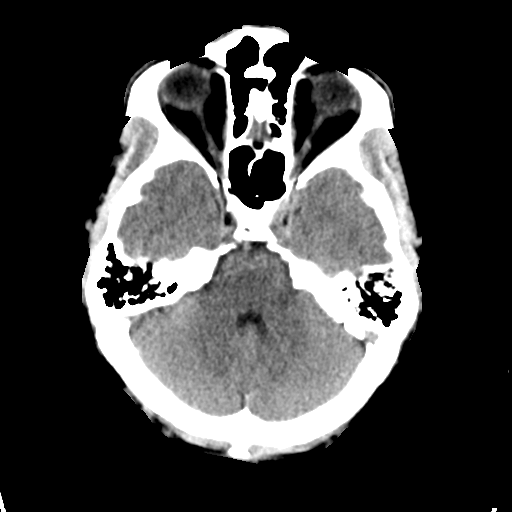
[im 13/35  brain]
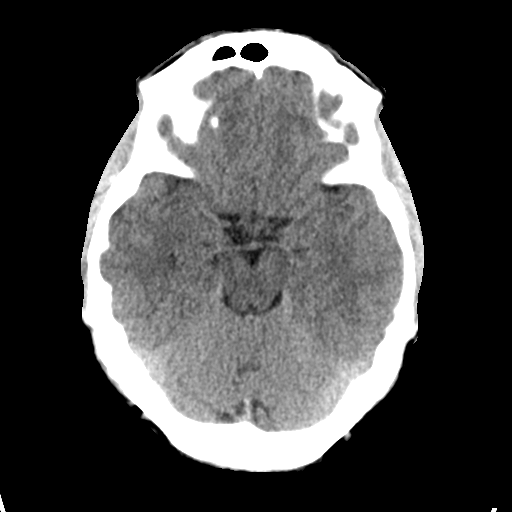
[im 18/35  brain]
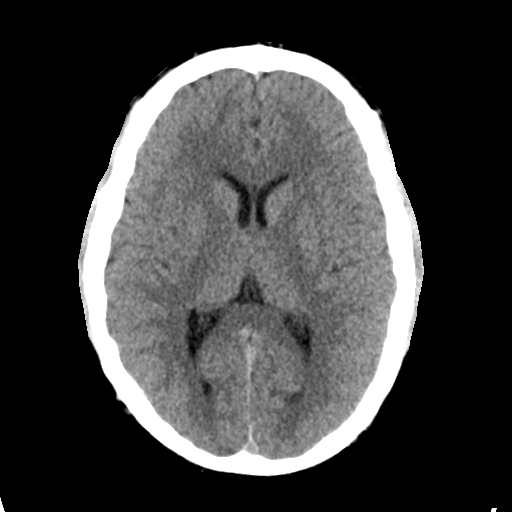
[im 18/35  bone]
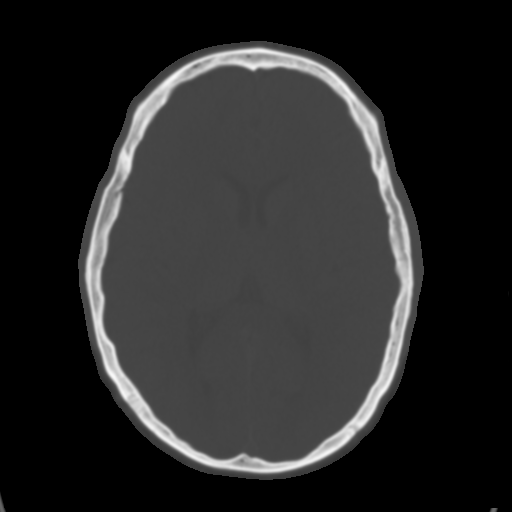
[im 22/35  brain]
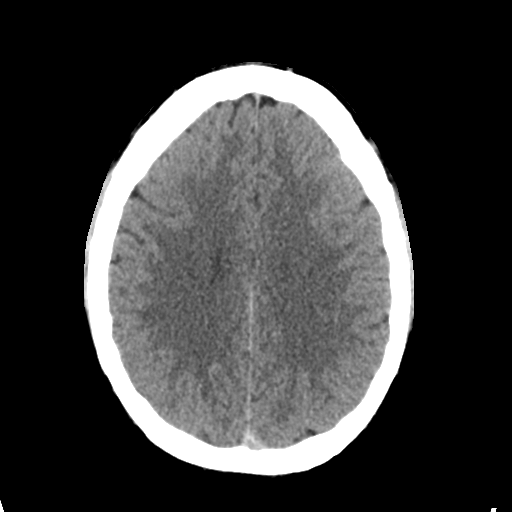
[im 25/35  brain]
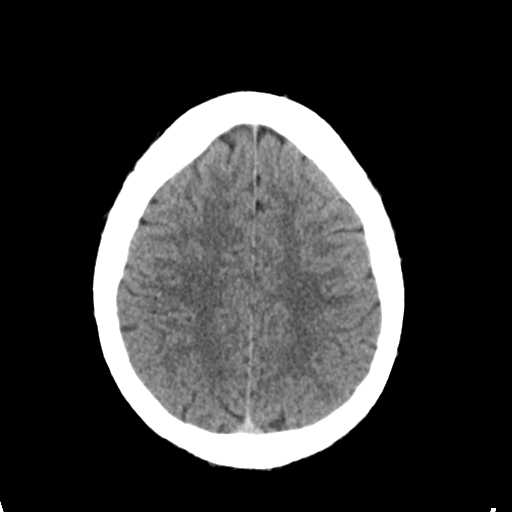
[im 29/35  brain]
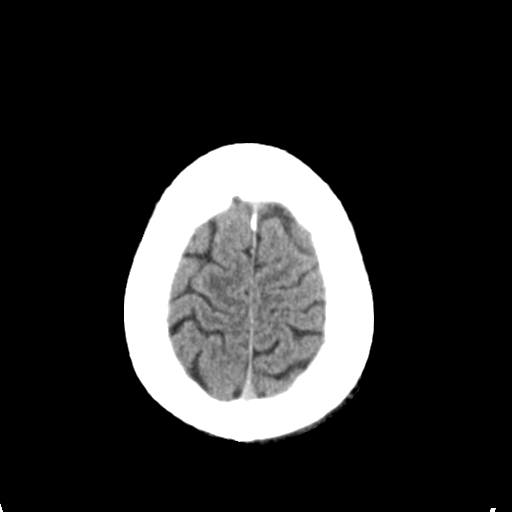
[im 32/35  brain]
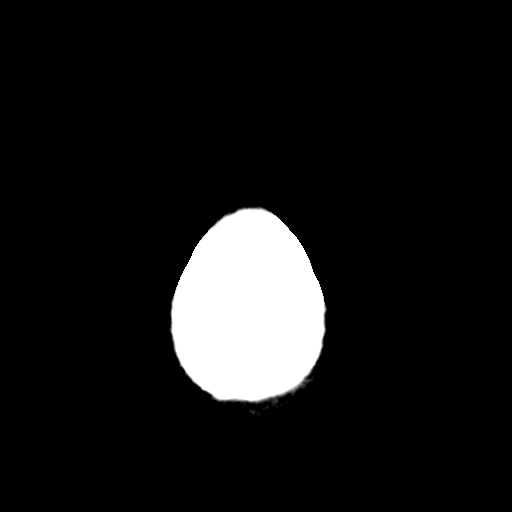
[im 32/35  bone]
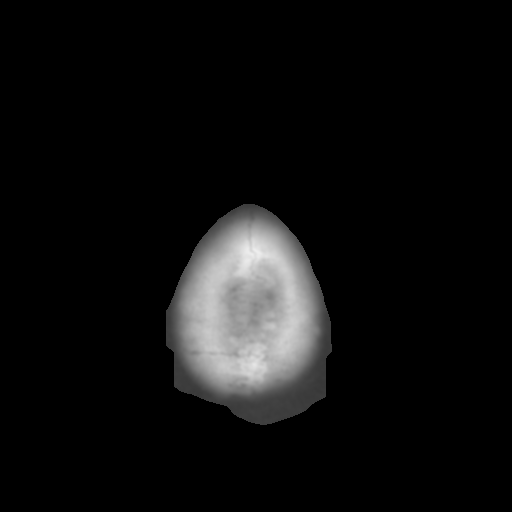

[Series 5: head 3.0 mpr cor · coronal · 0.33mm/px · 3 of 71 slices shown]
[im 24/71  brain]
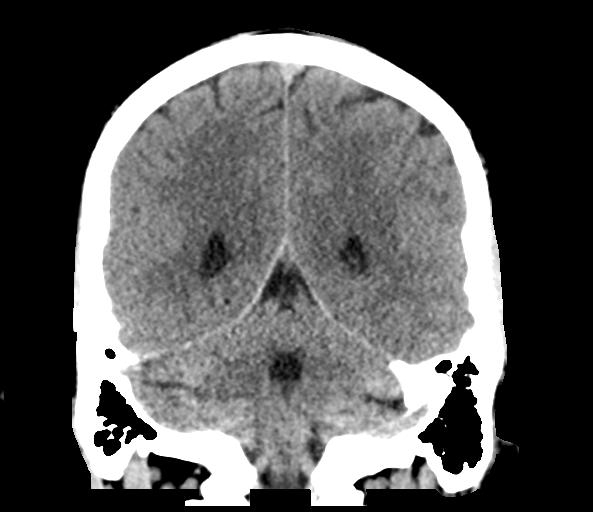
[im 32/71  brain]
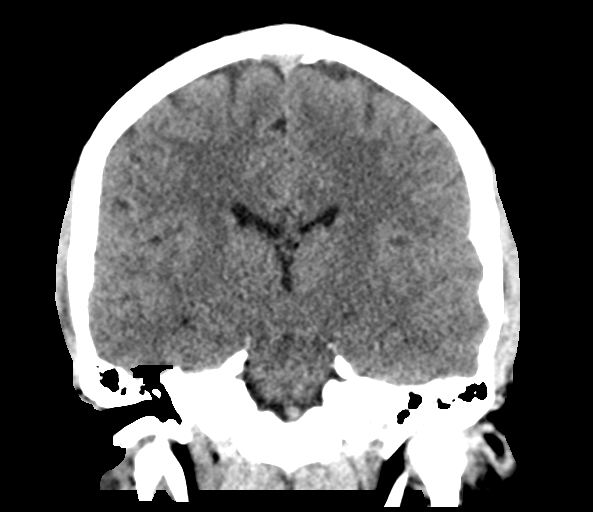
[im 39/71  brain]
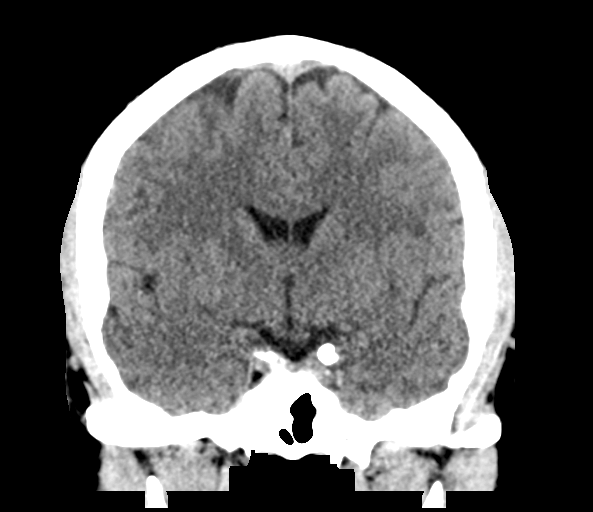

[Series 6: head 3.0 mpr sag · sagittal · 0.36mm/px · 3 of 61 slices shown]
[im 21/61  brain]
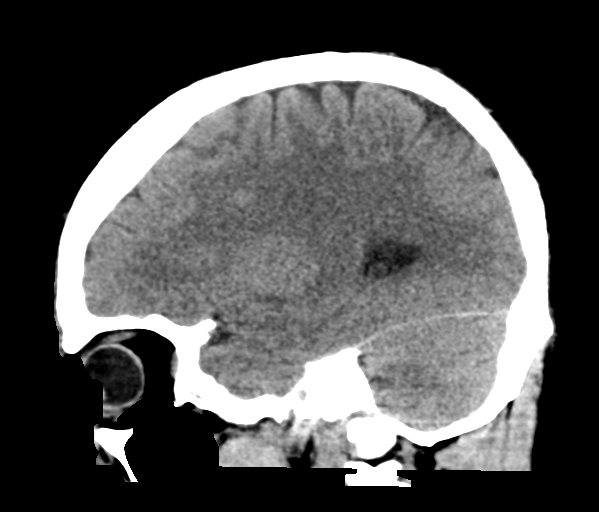
[im 31/61  brain]
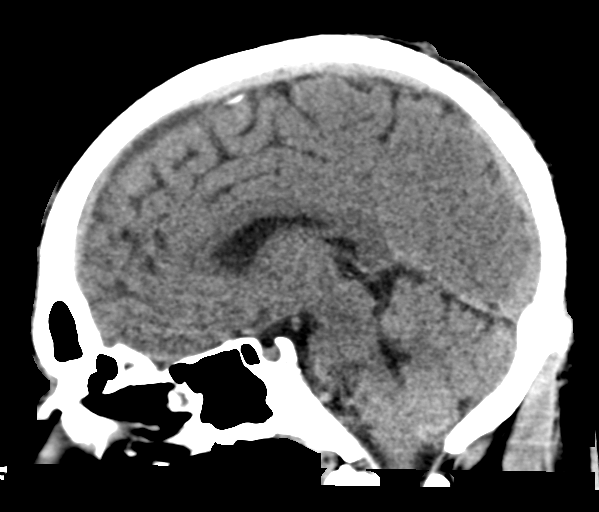
[im 41/61  brain]
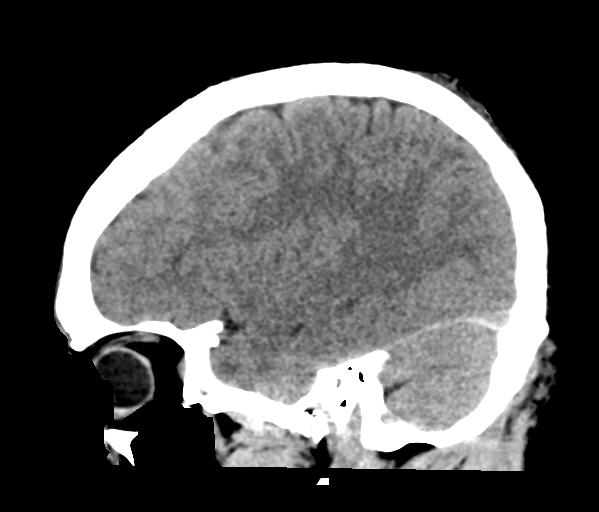

[15 of 47 positions shown; findings below may reference images not displayed]

FINDINGS: Brain:

Cerebral volume is normal.

There is no acute intracranial hemorrhage.

No demarcated cortical infarct.

No extra-axial fluid collection.

No evidence of intracranial mass.

No midline shift.

Vascular: No hyperdense vessel.  Atherosclerotic calcifications.

Skull: Normal. Negative for fracture or focal lesion.

Sinuses/Orbits: Visualized orbits show no acute finding. Mild
bilateral ethmoid, sphenoid and right maxillary sinus mucosal
thickening at the imaged levels.
IMPRESSION: No evidence of acute intracranial abnormality.

Mild paranasal sinus mucosal thickening, as described.
